# Patient Record
Sex: Female | Born: 1945 | Race: White | Hispanic: No | Marital: Married | Smoking: Never smoker
Health system: Southern US, Community
[De-identification: ages and names within clinical notes are randomized; demographics above are authoritative.]

## PROBLEM LIST (undated history)

## (undated) DIAGNOSIS — C569 Malignant neoplasm of unspecified ovary: Secondary | ICD-10-CM

## (undated) HISTORY — DX: Malignant neoplasm of unspecified ovary: C56.9

---

## 1998-06-30 ENCOUNTER — Encounter: Payer: Self-pay | Admitting: Obstetrics and Gynecology

## 1998-06-30 ENCOUNTER — Ambulatory Visit (HOSPITAL_COMMUNITY): Admission: RE | Admit: 1998-06-30 | Discharge: 1998-06-30 | Payer: Self-pay | Admitting: Obstetrics and Gynecology

## 1999-06-23 ENCOUNTER — Ambulatory Visit (HOSPITAL_COMMUNITY): Admission: RE | Admit: 1999-06-23 | Discharge: 1999-06-23 | Payer: Self-pay | Admitting: Obstetrics and Gynecology

## 1999-06-23 ENCOUNTER — Encounter: Payer: Self-pay | Admitting: Obstetrics and Gynecology

## 1999-07-09 ENCOUNTER — Other Ambulatory Visit: Admission: RE | Admit: 1999-07-09 | Discharge: 1999-07-09 | Payer: Self-pay | Admitting: Obstetrics and Gynecology

## 2000-06-24 ENCOUNTER — Ambulatory Visit (HOSPITAL_COMMUNITY): Admission: RE | Admit: 2000-06-24 | Discharge: 2000-06-24 | Payer: Self-pay | Admitting: Obstetrics and Gynecology

## 2000-06-24 ENCOUNTER — Encounter: Payer: Self-pay | Admitting: Obstetrics and Gynecology

## 2000-08-09 ENCOUNTER — Other Ambulatory Visit: Admission: RE | Admit: 2000-08-09 | Discharge: 2000-08-09 | Payer: Self-pay | Admitting: *Deleted

## 2001-07-10 ENCOUNTER — Encounter: Payer: Self-pay | Admitting: Obstetrics and Gynecology

## 2001-07-10 ENCOUNTER — Ambulatory Visit (HOSPITAL_COMMUNITY): Admission: RE | Admit: 2001-07-10 | Discharge: 2001-07-10 | Payer: Self-pay | Admitting: Obstetrics and Gynecology

## 2001-08-07 ENCOUNTER — Other Ambulatory Visit: Admission: RE | Admit: 2001-08-07 | Discharge: 2001-08-07 | Payer: Self-pay | Admitting: *Deleted

## 2002-06-28 ENCOUNTER — Ambulatory Visit (HOSPITAL_COMMUNITY): Admission: RE | Admit: 2002-06-28 | Discharge: 2002-06-28 | Payer: Self-pay | Admitting: Obstetrics and Gynecology

## 2002-06-28 ENCOUNTER — Encounter: Payer: Self-pay | Admitting: Obstetrics and Gynecology

## 2002-08-13 ENCOUNTER — Other Ambulatory Visit: Admission: RE | Admit: 2002-08-13 | Discharge: 2002-08-13 | Payer: Self-pay | Admitting: Obstetrics and Gynecology

## 2002-11-12 ENCOUNTER — Ambulatory Visit (HOSPITAL_COMMUNITY): Admission: RE | Admit: 2002-11-12 | Discharge: 2002-11-12 | Payer: Self-pay | Admitting: Gastroenterology

## 2003-07-02 ENCOUNTER — Ambulatory Visit (HOSPITAL_COMMUNITY): Admission: RE | Admit: 2003-07-02 | Discharge: 2003-07-02 | Payer: Self-pay | Admitting: Obstetrics and Gynecology

## 2003-09-04 ENCOUNTER — Other Ambulatory Visit: Admission: RE | Admit: 2003-09-04 | Discharge: 2003-09-04 | Payer: Self-pay | Admitting: Obstetrics and Gynecology

## 2004-07-03 ENCOUNTER — Ambulatory Visit (HOSPITAL_COMMUNITY): Admission: RE | Admit: 2004-07-03 | Discharge: 2004-07-03 | Payer: Self-pay | Admitting: Obstetrics and Gynecology

## 2004-09-08 ENCOUNTER — Other Ambulatory Visit: Admission: RE | Admit: 2004-09-08 | Discharge: 2004-09-08 | Payer: Self-pay | Admitting: Obstetrics and Gynecology

## 2005-07-12 ENCOUNTER — Ambulatory Visit (HOSPITAL_COMMUNITY): Admission: RE | Admit: 2005-07-12 | Discharge: 2005-07-12 | Payer: Self-pay | Admitting: Obstetrics and Gynecology

## 2005-09-21 ENCOUNTER — Other Ambulatory Visit: Admission: RE | Admit: 2005-09-21 | Discharge: 2005-09-21 | Payer: Self-pay | Admitting: Obstetrics and Gynecology

## 2006-07-26 ENCOUNTER — Ambulatory Visit (HOSPITAL_COMMUNITY): Admission: RE | Admit: 2006-07-26 | Discharge: 2006-07-26 | Payer: Self-pay | Admitting: Obstetrics and Gynecology

## 2006-09-23 ENCOUNTER — Other Ambulatory Visit: Admission: RE | Admit: 2006-09-23 | Discharge: 2006-09-23 | Payer: Self-pay | Admitting: Obstetrics & Gynecology

## 2007-08-16 ENCOUNTER — Ambulatory Visit: Admission: RE | Admit: 2007-08-16 | Discharge: 2007-08-16 | Payer: Self-pay | Admitting: Gynecologic Oncology

## 2007-08-22 ENCOUNTER — Ambulatory Visit (HOSPITAL_COMMUNITY): Admission: RE | Admit: 2007-08-22 | Discharge: 2007-08-22 | Payer: Self-pay | Admitting: Obstetrics and Gynecology

## 2007-08-30 DIAGNOSIS — C569 Malignant neoplasm of unspecified ovary: Secondary | ICD-10-CM

## 2007-08-30 HISTORY — PX: ABDOMINAL HYSTERECTOMY: SHX81

## 2007-08-30 HISTORY — DX: Malignant neoplasm of unspecified ovary: C56.9

## 2007-09-05 ENCOUNTER — Encounter: Payer: Self-pay | Admitting: Gynecology

## 2007-09-05 ENCOUNTER — Inpatient Hospital Stay (HOSPITAL_COMMUNITY): Admission: RE | Admit: 2007-09-05 | Discharge: 2007-09-07 | Payer: Self-pay | Admitting: Obstetrics & Gynecology

## 2007-09-06 ENCOUNTER — Ambulatory Visit: Payer: Self-pay | Admitting: Oncology

## 2007-09-26 ENCOUNTER — Ambulatory Visit: Admission: RE | Admit: 2007-09-26 | Discharge: 2007-09-26 | Payer: Self-pay | Admitting: Gynecologic Oncology

## 2007-10-02 LAB — CBC WITH DIFFERENTIAL/PLATELET
BASO%: 0.6 % (ref 0.0–2.0)
Basophils Absolute: 0 10*3/uL (ref 0.0–0.1)
HCT: 39.6 % (ref 34.8–46.6)
MCH: 31.5 pg (ref 26.0–34.0)
MCHC: 34.1 g/dL (ref 32.0–36.0)
MCV: 92.3 fL (ref 81.0–101.0)
NEUT#: 2.8 10*3/uL (ref 1.5–6.5)
Platelets: 338 10*3/uL (ref 145–400)
RBC: 4.28 10*6/uL (ref 3.70–5.32)
WBC: 4.7 10*3/uL (ref 3.9–10.0)
lymph#: 1.2 10*3/uL (ref 0.9–3.3)

## 2007-10-02 LAB — COMPREHENSIVE METABOLIC PANEL
Albumin: 4.3 g/dL (ref 3.5–5.2)
Alkaline Phosphatase: 54 U/L (ref 39–117)
CO2: 25 mEq/L (ref 19–32)
Calcium: 10 mg/dL (ref 8.4–10.5)
Chloride: 106 mEq/L (ref 96–112)
Sodium: 141 mEq/L (ref 135–145)
Total Protein: 7.1 g/dL (ref 6.0–8.3)

## 2007-11-14 ENCOUNTER — Ambulatory Visit: Admission: RE | Admit: 2007-11-14 | Discharge: 2007-11-14 | Payer: Self-pay | Admitting: Gynecologic Oncology

## 2008-02-13 ENCOUNTER — Ambulatory Visit: Admission: RE | Admit: 2008-02-13 | Discharge: 2008-02-13 | Payer: Self-pay | Admitting: Gynecologic Oncology

## 2008-02-29 ENCOUNTER — Ambulatory Visit: Payer: Self-pay | Admitting: Internal Medicine

## 2008-02-29 DIAGNOSIS — Z8543 Personal history of malignant neoplasm of ovary: Secondary | ICD-10-CM | POA: Insufficient documentation

## 2008-03-13 ENCOUNTER — Ambulatory Visit: Payer: Self-pay | Admitting: Internal Medicine

## 2008-03-13 LAB — CONVERTED CEMR LAB
BUN: 13 mg/dL (ref 6–23)
Basophils Absolute: 0.1 10*3/uL (ref 0.0–0.1)
Basophils Relative: 1.7 % (ref 0.0–3.0)
Bilirubin, Direct: 0.1 mg/dL (ref 0.0–0.3)
CO2: 32 meq/L (ref 19–32)
Creatinine, Ser: 0.7 mg/dL (ref 0.4–1.2)
Eosinophils Absolute: 0.1 10*3/uL (ref 0.0–0.7)
Eosinophils Relative: 2.8 % (ref 0.0–5.0)
GFR calc non Af Amer: 91 mL/min
HCT: 40.8 % (ref 36.0–46.0)
HDL: 40.8 mg/dL (ref >39.0)
Hemoglobin: 13.9 g/dL (ref 12.0–15.0)
Lymphocytes Relative: 33.4 % (ref 12.0–46.0)
Neutrophils Relative %: 53.3 % (ref 43.0–77.0)
RBC: 4.3 M/uL (ref 3.87–5.11)
RDW: 12.7 % (ref 11.5–14.6)
Total CHOL/HDL Ratio: 3.8
Total Protein: 6.8 g/dL (ref 6.0–8.3)
VLDL: 9 mg/dL (ref 0–40)

## 2008-05-15 ENCOUNTER — Ambulatory Visit: Admission: RE | Admit: 2008-05-15 | Discharge: 2008-05-15 | Payer: Self-pay | Admitting: Gynecologic Oncology

## 2008-06-18 ENCOUNTER — Ambulatory Visit: Payer: Self-pay | Admitting: Internal Medicine

## 2008-06-18 DIAGNOSIS — K3189 Other diseases of stomach and duodenum: Secondary | ICD-10-CM | POA: Insufficient documentation

## 2008-06-18 DIAGNOSIS — R1013 Epigastric pain: Secondary | ICD-10-CM

## 2008-08-06 ENCOUNTER — Ambulatory Visit (HOSPITAL_COMMUNITY): Admission: RE | Admit: 2008-08-06 | Discharge: 2008-08-06 | Payer: Self-pay | Admitting: Gynecologic Oncology

## 2008-08-07 ENCOUNTER — Ambulatory Visit: Admission: RE | Admit: 2008-08-07 | Discharge: 2008-08-07 | Payer: Self-pay | Admitting: Cardiothoracic Surgery

## 2008-08-07 ENCOUNTER — Encounter: Payer: Self-pay | Admitting: Internal Medicine

## 2008-08-09 ENCOUNTER — Ambulatory Visit (HOSPITAL_COMMUNITY): Admission: RE | Admit: 2008-08-09 | Discharge: 2008-08-09 | Payer: Self-pay | Admitting: Gynecologic Oncology

## 2008-08-26 ENCOUNTER — Telehealth: Payer: Self-pay | Admitting: Internal Medicine

## 2008-09-03 ENCOUNTER — Ambulatory Visit (HOSPITAL_COMMUNITY): Admission: RE | Admit: 2008-09-03 | Discharge: 2008-09-03 | Payer: Self-pay | Admitting: Obstetrics and Gynecology

## 2008-12-11 ENCOUNTER — Ambulatory Visit: Admission: RE | Admit: 2008-12-11 | Discharge: 2008-12-11 | Payer: Self-pay | Admitting: Gynecologic Oncology

## 2008-12-11 ENCOUNTER — Encounter: Payer: Self-pay | Admitting: Internal Medicine

## 2009-02-11 ENCOUNTER — Ambulatory Visit: Payer: Self-pay | Admitting: Internal Medicine

## 2009-02-11 DIAGNOSIS — M542 Cervicalgia: Secondary | ICD-10-CM | POA: Insufficient documentation

## 2009-02-11 LAB — CONVERTED CEMR LAB

## 2009-03-04 ENCOUNTER — Ambulatory Visit: Payer: Self-pay | Admitting: Internal Medicine

## 2009-03-04 LAB — CONVERTED CEMR LAB
AST: 21 units/L (ref 0–37)
Basophils Absolute: 0 10*3/uL (ref 0.0–0.1)
Basophils Relative: 1 % (ref 0–1)
Bilirubin, Direct: 0.1 mg/dL (ref 0.0–0.3)
Cholesterol: 169 mg/dL (ref 0–200)
Eosinophils Absolute: 0.1 10*3/uL (ref 0.0–0.7)
HDL: 53 mg/dL (ref >39)
Hemoglobin, Urine: NEGATIVE
Hemoglobin: 14.4 g/dL (ref 12.0–15.0)
LDL Cholesterol: 101 mg/dL — ABNORMAL HIGH (ref 0–99)
Lymphocytes Relative: 36 % (ref 12–46)
MCHC: 33.2 g/dL (ref 30.0–36.0)
Platelets: 299 10*3/uL (ref 150–400)
Potassium: 4.7 meq/L (ref 3.5–5.3)
RBC: 4.56 M/uL (ref 3.87–5.11)
RDW: 12.7 % (ref 11.5–15.5)
Specific Gravity, Urine: 1.004 — ABNORMAL LOW (ref 1.005–1.030)
Total Bilirubin: 0.6 mg/dL (ref 0.3–1.2)
Total CHOL/HDL Ratio: 3.2
Total Protein: 7.2 g/dL (ref 6.0–8.3)
Urobilinogen, UA: 0.2 (ref 0.0–1.0)
WBC: 5 10*3/uL (ref 4.0–10.5)
pH: 7.5 (ref 5.0–8.0)

## 2009-03-07 ENCOUNTER — Ambulatory Visit: Payer: Self-pay | Admitting: Internal Medicine

## 2009-03-07 DIAGNOSIS — R0989 Other specified symptoms and signs involving the circulatory and respiratory systems: Secondary | ICD-10-CM | POA: Insufficient documentation

## 2009-03-14 ENCOUNTER — Ambulatory Visit: Payer: Self-pay

## 2009-03-14 ENCOUNTER — Encounter: Payer: Self-pay | Admitting: Internal Medicine

## 2009-03-17 ENCOUNTER — Telehealth: Payer: Self-pay | Admitting: Internal Medicine

## 2009-03-17 ENCOUNTER — Encounter: Payer: Self-pay | Admitting: Internal Medicine

## 2009-04-16 ENCOUNTER — Ambulatory Visit: Admission: RE | Admit: 2009-04-16 | Discharge: 2009-04-16 | Payer: Self-pay | Admitting: Gynecologic Oncology

## 2009-04-16 ENCOUNTER — Encounter: Payer: Self-pay | Admitting: Internal Medicine

## 2009-09-10 ENCOUNTER — Ambulatory Visit: Admission: RE | Admit: 2009-09-10 | Discharge: 2009-09-10 | Payer: Self-pay | Admitting: Gynecologic Oncology

## 2009-09-16 ENCOUNTER — Ambulatory Visit (HOSPITAL_COMMUNITY): Admission: RE | Admit: 2009-09-16 | Discharge: 2009-09-16 | Payer: Self-pay | Admitting: Obstetrics and Gynecology

## 2009-10-17 ENCOUNTER — Encounter: Payer: Self-pay | Admitting: Internal Medicine

## 2010-02-11 ENCOUNTER — Ambulatory Visit: Admission: RE | Admit: 2010-02-11 | Discharge: 2010-02-11 | Payer: Self-pay | Admitting: Gynecologic Oncology

## 2010-04-07 ENCOUNTER — Telehealth: Payer: Self-pay | Admitting: Internal Medicine

## 2010-06-30 NOTE — Procedures (Signed)
Summary: Colonoscopy/[facility]  Colonoscopy/[facility]   Imported By: Lanelle Bal 10/28/2009 08:57:18  _____________________________________________________________________  External Attachment:    Type:   Image     Comment:   External Document

## 2010-06-30 NOTE — Progress Notes (Signed)
Summary: Lab Work  Phone Note Call from Patient Call back at [employer] [phone]   Caller: Patient Summary of Call: patient has scheduled a CPX for 12/8  are there additional labs needed for her outside of the routine labs? Initial call taken by: Glendell Docker CMA,  April 07, 2010 2:24 PM  Follow-up for Phone Call        is she getting CA - 125 ( tumor marker ) followed by oncologist? Follow-up by: D. Thomos Lemons DO,  April 07, 2010 3:23 PM  Additional Follow-up for Phone Call Additional follow up Details #1::        call paced to patient at [phone], no answer. A detailed voice message was left for patient to return call regarding blood test.  Additional Follow-up by: Glendell Docker CMA,  April 07, 2010 3:49 PM    Additional Follow-up for Phone Call Additional follow up Details #2::    patient returned call and stated she had testing done in September and it was normal Glendell Docker CMA  April 07, 2010 3:54 PM   Additional Follow-up for Phone Call Additional follow up Details #3:: Details for Additional Follow-up Action Taken: I suggest following labs BMP prior to visit, ICD-9:   Hepatic Panel prior to visit, ICD-9:  Lipid Panel prior to visit, ICD-9:   TSH prior to visit, ICD-9:  High sensitivity CRP :    use v70 code   Additional Follow-up by: D. Thomos Lemons DO,  April 08, 2010 11:34 AM  call was returned to patient, she was made aware of the fasting blood work needed prior to her scheduled appointment for December 8th, lab order entered for the last week in November  Glendell Docker [facility]  April 08, 2010 1:43 PM

## 2010-07-08 ENCOUNTER — Other Ambulatory Visit: Payer: Self-pay | Admitting: Gynecologic Oncology

## 2010-07-08 ENCOUNTER — Ambulatory Visit: Payer: BC Managed Care – PPO | Attending: Gynecologic Oncology | Admitting: Gynecologic Oncology

## 2010-07-08 DIAGNOSIS — R609 Edema, unspecified: Secondary | ICD-10-CM | POA: Insufficient documentation

## 2010-07-08 DIAGNOSIS — Z09 Encounter for follow-up examination after completed treatment for conditions other than malignant neoplasm: Secondary | ICD-10-CM | POA: Insufficient documentation

## 2010-07-08 DIAGNOSIS — C569 Malignant neoplasm of unspecified ovary: Secondary | ICD-10-CM | POA: Insufficient documentation

## 2010-08-21 NOTE — Consult Note (Signed)
NAMETEMPESTT, SILBA                ACCOUNT NO.:  [id number]  MEDICAL RECORD NO.:  [id number]           PATIENT TYPE:  LOCATION:                                 FACILITY:  PHYSICIAN:  Aracelis Ulrey A. 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  At that time, she underwent surgery and appropriate staging.  Pathology revealed a stage IA clear cell carcinoma, measuring 13 cm.  It was recommended that she undergo postoperative adjuvant chemotherapy with paclitaxel and carboplatin, but she declined.  She has been followed expectantly since that time.  She is overall doing quite well and really has no complaints.  I last saw her in September 2011 at which time her exam was unremarkable and her CA-125 was normal at 7.4.  She overall is doing quite well.  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  She does have some occasional right lower extremity edema that is really a bigger issue for her in the summer months and it is in the winter.  The right lower quadrant tingy pain that she had before is abated itself somewhat. She continues to exercise and is enjoying a good quality of life.  REVIEW OF SYSTEMS:  Negative.  FAMILY HISTORY:  Noncontributory.  There are no new medical problems. Her grandson is year and half old.  MEDICATION LIST:  Reviewed and is unchanged.  PHYSICAL EXAMINATION:  VITAL SIGNS:  Weight 152 pounds, height 5 feet 10 inches, blood pressure 152/88, pulse 72, respirations 16, temperature 97.9. GENERAL:  A well-nourished and well-developed  female, in no acute distress. NECK:  Supple.  There is no lymphadenopathy, no thyromegaly. LUNGS:  Clear to auscultation bilaterally. CARDIOVASCULAR:  Regular rate and rhythm. ABDOMEN:  A well-healed vertical midline incision.  Abdomen is soft, nontender, nondistended.  There are no palpable masses or hepatosplenomegaly.  Groins are negative for adenopathy. EXTREMITIES:  There is no edema. PELVIC:  External genitalia is within normal limits.  Bimanual examination reveals no masses or nodularity.  Rectal confirms.  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  PLAN: 1. We will follow up the results of her CA-125 from today.  If all is     well, she will return to see [place] in 6 months.     She knows to contact us if she has any issues prior to her next     visit. 2. She is due for a mammogram in April. 3. She had a colonoscopy in June 2011 and is fine for 5 years.  Scarlette Hogston A. Duard Brady, MD     PAG/MEDQ  D:  07/08/2010  T:  07/08/2010  Job:  [id number]  cc:   Edwena Felty. Romine, M.D. Fax: [phone]  Telford Nab, R.N. [address] [place], [place] [address]  Lennis P. Darrold Span, M.D. Fax: [phone]  Barbette Hair. Lockhart, DO [address] [place], [place] [address]  Dr. Jerline Pain  Electronically Signed by Cleda Mccreedy MD on 07/16/2010 10:18:39 AM

## 2010-09-02 LAB — CA 125: CA 125: 6.5 U/mL (ref 0.0–30.2)

## 2010-09-10 LAB — GLUCOSE, CAPILLARY: Glucose-Capillary: 99 mg/dL (ref 70–99)

## 2010-09-10 LAB — CA 125: CA 125: 7.4 U/mL (ref 0.0–30.2)

## 2010-10-05 ENCOUNTER — Other Ambulatory Visit: Payer: Self-pay | Admitting: Obstetrics and Gynecology

## 2010-10-05 DIAGNOSIS — Z1231 Encounter for screening mammogram for malignant neoplasm of breast: Secondary | ICD-10-CM

## 2010-10-13 NOTE — Consult Note (Signed)
NAMESHATIRA, DOBOSZ                ACCOUNT NO.:  [id number]   MEDICAL RECORD NO.:  [id number]          PATIENT TYPE:  OUT   LOCATION:  GYN                          FACILITY:  [facility]   PHYSICIAN:  Paola A. Duard Brady, MD    DATE OF BIRTH:  [birth year]   DATE OF CONSULTATION:  DATE OF DISCHARGE:                                 CONSULTATION   [name] is a 65 year old with a history of a stage IA clear cell  carcinoma of the ovary.  Please refer to my extensive note from September 26, 2007 as well as that from Dr. Jama Flavors on Oct 02, 2007.  Briefly, the patient presented as a referral from Dr. Tresa Res for  abdominal pelvic mass with a normal CA125.  April 17 she underwent  TAH/BSO and appropriate staging.  Final pathology revealed the clear  cell carcinoma measuring 13 cm arising from the right ovary with  negative surface involvement. Lymph nodes were negative, washings,  peritoneal biopsies, omentum, etc.  It was recommended that she proceed  with six cycles of postoperative chemotherapy with Taxol and  carboplatin.  She was very undecided regarding her treatment strategy  when we saw her in April.  She did agree to see Dr. Darrold Span.  Prior to  that, she did miss a chemotherapy teaching appointment.  After she saw  Dr. Darrold Span, it appears that the patient has yet to make a final  decision, though she appears to be leaning towards close followup  She  continues to do well and states she is recovering well from the surgery.  She does complain of a small amount of swelling in the lower mons area,  increases during the day.  It is much better in the morning.  She is  also complaining of some lower back pain initially upon waking up.  It  improves during the day as she gets up and stretches.  Her bowels her  regular.  She denies any change in her bowel or bladder habits.  Towards  the right side towards the umbilicus is a little bit more sensitive with  movement.  It is better this week than it  had been.  Since we last saw  her, her mother did pass away, which has been a stressor for her.  She  otherwise denies any significant complaints.   PHYSICAL EXAMINATION:  Weight 147.5 pounds.  Blood pressure 125/84.  ABDOMEN:  Shows well-healed vertical midline incision.  She does have  some edema overlying the mons.  Groins are negative for adenopathy.  EXTREMITIES:  There is no edema.  PELVIC:  External genitalia is within normal limits though atrophic.  The vagina is atrophic.  The vaginal cuff is visualized.  There are no  visible lesions.  Bimanual examination reveals no masses or nodularity.  Rectal confirms.   65 year old with a surgical stage IA clear cell carcinoma of the  ovary, who appears to be leaning towards conservative followup and no  adjuvant chemotherapy, though she has not completely defined her  treatment strategy at this point.  I discussed with  her that we really  have only about 2 more weeks to finalize a decision as you start losing  the therapeutic benefit of adjuvant therapy with significant delays.  She seems to understand that.  She has been offered second opinions both  by [place] as well as Dr. Darrold Span, and at this point she states she is  considering going to [facility] as they can integrate  conventional chemotherapy with complimentary medications.  Again I urged  her if she is going to seek a second opinion, to make this appointment.  We would be happy to help her if she would like.  She deferred that  request.  However, if she is going to have adjuvant therapy, it was  stressed upon her and she appeared to understand the need for immediacy.  Should she decide not to proceed with adjuvant  therapy, we will follow her continuously and she will return to see me  in 3 months.  We will schedule her, should she opt for not proceeding  with chemotherapy, for a CT of the abdomen, pelvis in April, which is  the anniversary of her surgery.   She is requesting that we get a vitamin  D level today.  She will notify us of her decision.      Paola A. Duard Brady, MD  Electronically Signed     PAG/MEDQ  D:  11/14/2007  T:  11/14/2007  Job:  [id number]   cc:   Edwena Felty. Romine, M.D.  Fax: [phone]   Telford Nab, R.N.  [address]  [place], [place] [address]   Lennis P. Darrold Span, M.D.  Fax: [phone]

## 2010-10-13 NOTE — Consult Note (Signed)
[name], [name]                ACCOUNT NO.:  [id number]   MEDICAL RECORD NO.:  [id number]          PATIENT TYPE:  OUT   LOCATION:  GYN                          FACILITY:  [facility]   PHYSICIAN:  Paola A. Duard Brady, MD    DATE OF BIRTH:  05-Feb-1946   DATE OF CONSULTATION:  08/16/2007  DATE OF DISCHARGE:                                 CONSULTATION   HISTORY:  The patient is a 65 year old gravida 1, para 1 who began  having some bloating in January of 2009 with some increased gas and  discomfort.  Really no significant pain, but what she described as some  twinges.  She had known fibroids, and thought that this was causing  these symptoms.  She also thought that maybe with the increasing  flatulence, she had some irritable bowel syndrome.  The symptoms really  were not getting any worse, but persistent.  She went to see Dr. Tresa Res  with complaints of 3-4 weeks of pelvic discomfort and bloating.   Examination at that time revealed an abdominal pelvic mass.  Ultrasound  was performed that revealed a large complex mass measuring 14 x 11.8 x  9.5 cm seen anterior to the uterus at the midline in location with an  irregular shaped solid component which measures 7 x 4.6 cm.  There were  blood vessels noted within the area that displayed high diastolic flow.  There was no significant ascites in the mass or within the pelvis.  The  right adnexa, itself, could not be easily identified.  A CA-125 was  performed that was 9.5.  It was felt that this mass appeared to be  separate from the uterus, and was most likely adnexal in origin.  She  states that since that evaluation, again, there have been no significant  symptom changes.  The symptoms are persistent and uncomfortable.  She  denies any significant pain requiring any pain medication.  She sleeps  well at night.  The pain does not wake her up at night.  She does not  have any change in bowel or bladder habits.  She does have some pressure  which  increases some urinary frequency.  She denies any nausea,  vomiting, or early satiety.  She has been menopausal since her 51s with  no hormone replacement therapy, and she has had no vaginal bleeding.   PAST MEDICAL HISTORY:  None.   PAST SURGICAL HISTORY:  None.   MEDICATIONS:  Multivitamin, calcium, vitamin D, and herbal supplements.   ALLERGIES:  SULFA which causes itching and throat tightness.   SOCIAL HISTORY:  She denies any use of tobacco or alcohol.  She is  married.  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  Her  husband works at the [employer].  She has a son who is 58.  She  has no grandchildren.   FAMILY HISTORY:  Her father died of an MI at 5.  Her mother is 25 and  has had a stroke and diabetes.  Maternal grandfather with colon cancer.  She has a sister with breast cancer  in her 28s.  She is not sure if she  was menopausal.   HEALTH MAINTENANCE:  She is up-to-date on her colonoscopies with her  last one being in June of 2004.  She states that she will have one this  June as it will be 5 years.  Mammogram she is up-to-date.   PHYSICAL EXAMINATION:  VITAL SIGNS:  Weight 149 pounds, height 5 feet 9-  1/2 inches.  Blood pressure 144/82, pulse 88.  GENERAL:  A well-nourished, well-developed female in no acute distress.  NECK:  Supple.  There is no lymphadenopathy, no thyromegaly.  LUNGS:  Clear to auscultation bilaterally.  CARDIOVASCULAR EXAM:  Regular rate and rhythm.  ABDOMEN:  There is an abdominal pelvic mass palpable about 4  fingerbreadths below the umbilicus.  It is nontender.  There are no  other masses or hepatosplenomegaly.  Groins are negative for adenopathy.  EXTREMITIES:  There is no edema.  PELVIC:  External genitalia is within normal limits.  On bimanual  examination the cervix is palpably normal.  There is an abdominal pelvic  mass measuring approximately 16 cm, and it is freely mobile.  I cannot  separate the mass from the  uterus, it moves with the uterus, and it  could be either a large fibroid versus an ovarian mass that is adherent  to the anterior portion of the uterus.  It does feel to be anterior to  the uterus, and deviating the uterus posteriorly.  RECTOVAGINAL EXAMINATION:  Reveals no nodularity.   IMPRESSION:  A pleasant 65 year old with a complex 14 cm abdominal  pelvic mass normal CA-125.  The mass has both cystic and solid  components which are worrisome for a neoplastic process; however,  reassuring features include normal CA-125, mobility of the mass, and  lack of nodularity and ascites within the pelvis.  The etiology could  either be a primary uterine mass versus an ovarian process.  I do not  feel that this is something that can be laparoscopically evaluated.  The  patient needs exploratory an laparotomy TAH-BSO and appropriate staging.  The patient is open to this.  We will work on getting her scheduled.  The risks and benefits of surgery were discussed.  She knows that the  mass will be sent for frozen section.  If malignant, she will undergo  appropriate staging.  She is willing to undergo hysterectomy and  contralateral BSO if this is arising from the ovary even if it is benign  process.  We will attempt to coordinate the surgery with our service and  Dr. Harlene Salts and Miller's office.  We understand that Dr. Tresa Res will be  out of the office for a period of time, and if Dr. Hyacinth Meeker is not able to  assist [place],  the patient is happy to have Dr. Tamela Oddi assisting [place]  with the surgery.  I will coordinate the schedules and contact the  patient with the next reasonable date available.  She understands that  if I am not available in a timely fashion, that one of my partners will  do the surgery.  She will contemplate whether this is acceptable to her,  so that when [place] calls her with potential dates, we can get her  scheduled.  Her questions were answered and elicited to her   satisfaction.  We will schedule her for surgery.      Paola A. Duard Brady, MD  Electronically Signed     PAG/MEDQ  D:  08/16/2007  T:  08/17/2007  Job:  [id number]   cc:   Edwena Felty. Romine, M.D.  Fax: [phone]   Telford Nab, R.N.  [address]  [place], [place] [address]

## 2010-10-13 NOTE — Consult Note (Signed)
[name], [name]                ACCOUNT NO.:  [id number]   MEDICAL RECORD NO.:  [id number]          PATIENT TYPE:  OUT   LOCATION:  GYN                          FACILITY:  [facility]   PHYSICIAN:  Paola A. Duard Brady, MD    DATE OF BIRTH:  [birth year]   DATE OF CONSULTATION:  09/26/2007  DATE OF DISCHARGE:                                 CONSULTATION   Ms. Warrick is a very pleasant, 65 year old who we initially saw as a  referral from Dr. Tresa Res. At that time, she had an abdominal pelvic mass  measuring approximately 14 x 11.8 x 9.5 cm.  She had a CA-125 that was  normal.  On physical examination, she had an abdominal pelvic mass  measuring 16 cm that was freely mobile.  She subsequently underwent TAH-  BSO and appropriate staging on September 15, 2007.  Operative findings  revealed a 12-cm cystic mass arising from the right ovary.  The external  surface was smooth.  There were no excrescences, and there was no  appearance of the surface.  There was some small fibroids in the uterus.  Left tube and ovary were normal.  Pathology was consistent with a clear  cell carcinoma measuring 13 cm.  It was arising from the right ovary  with a negative surface.  The cervix, endometrioma, myometrium, serosa,  left adnexa were negative.  She did have some atrophic endometriosis on  her contralateral adnexa.  Peritoneal biopsies, cul-de-sac biopsies,  omentum and washings were all negative as was the appendix.  A 0 out of  14 nodes were involved.  Her tumor was described as a clear cell  carcinoma.  Her final stage was stage IA grade 3 carcinoma.  She comes  in today for a brief postoperative check but he mostly for chemotherapy-  related discussion.  She is walking about a quarter to half a mile a  day.  She is lifting no more than 10 pounds a day.  She has gone back to  work a little bit from home.  Her energy level is good.  She  occasionally has some twinges in her bilateral lower quadrant that feels  a  little bit tight.  She is sleeping well at night.  She is eating well.  She denies any change in bowel or bladder habits.  She is inquiring  regarding travel as her mother has pneumonia and is not doing well in  [place], and she would like to go see her.  She brought a long list of  questions today that were addressed.  The biggest issue seems to really  surround whether or not she would like to proceed with chemotherapy.  Her husband is here with her today.  He has a history of stage III  bladder carcinoma diagnosed and treated 7 years ago and has been in  remission since that time, and some of the questions really seem to  surround was some malignancies require adjuvant therapy and others do  not.   PHYSICAL EXAMINATION:  Well-nourished, well-developed female in no acute  distress.  ABDOMEN:  She has  a well-healing vertical midline incision.  There was  one Steri-Stripped place which was removed without difficulty.  Abdomen  is soft, appropriately tender.  There are normal postoperative changes  and edema.   ASSESSMENT:  A 65 year old with a stage IA clear cell carcinoma of the  ovary who is doing well for this point in her postoperative status.  She  is only three weeks out from surgery.   PLAN:  Again, we had a lengthy discussion.  She was given a copy of her  pathology report.  They inquired whether or not that she get a second  opinion for pathology.  I discussed that they are weighs welcome to get  a second opinion regarding pathology.  If they choose to do so, that  would be their choice and decision.  We had a discussion regarding  chemotherapy.  I would recommend 6 cycles of paclitaxel and carboplatin.  She was given the opportunity the write the names of these chemotherapy  agents down.  I did discuss the most common risks and side effects  associated with these chemotherapeutic agents including alopecia,  neuropathy, and issues with her blood counts.  I discussed with her  that  she be able to work full time.  She inquired whether she would be able  to do any immune-enhancing supplementation during chemotherapy, and I  discussed with her that my patients often do seek other adjuvant and  holistic approaches.  I only asked that I be able to review them prior  to them starting anything.  I am sure Dr. Darrold Span would feel similarly.  The patient does have an appointment see Dr. Darrold Span on May 4 at 9:30.  She was given the date and time.  At this point, she is not fully  admitted to proceeding with chemotherapy but does agree with seeing Dr.  Darrold Span as it would be a good information-gathering session.  She can  then make her final decision.  I did discuss with her that she currently  has approximately a 20-25% chance of having a cancer recurrence and then  adjuvant therapy may decrease the recurrence risk in half, but she would  still have about a 8-10% risk of cancer recurrence based on her  histology alone.  She seemed to understand that.  Their questions were  elicited and answered to their satisfaction.  They follow-up with Dr.  Darrold Span, and they notify us of their final decision regarding adjuvant  therapy.      Paola A. Duard Brady, MD  Electronically Signed     PAG/MEDQ  D:  09/26/2007  T:  09/26/2007  Job:  [id number]   cc:   Aram Beecham P. Romine, M.D.  Fax: [phone]   Telford Nab, R.N.  [address]  [place], [place] [address]   Lennis P. Darrold Span, M.D.  Fax: [phone]

## 2010-10-13 NOTE — Op Note (Signed)
[name], [name]                ACCOUNT NO.:  [id number]   MEDICAL RECORD NO.:  [id number]          PATIENT TYPE:  INP   LOCATION:  0002                         FACILITY:  [facility]   PHYSICIAN:  De Blanch, M.D.DATE OF BIRTH:  12-Sep-1945   DATE OF PROCEDURE:  09/05/2007  DATE OF DISCHARGE:                               OPERATIVE REPORT   PREOPERATIVE DIAGNOSIS:  Complex pelvic mass.   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   PROCEDURE:  Total abdominal hysterectomy, bilateral salpingo-  oophorectomy, staging for ovarian cancer including omentectomy, pelvic  and periaortic lymphadenectomy, multiple peritoneal biopsies, peritoneal  washings, diaphragm scrape and appendectomy.   SURGEON:  De Blanch, M.D.   FIRST ASSISTANT:  Antionette Char MD, Telford Nab, R.N.   ANESTHESIA:  General with orotracheal tube.   ESTIMATED BLOOD LOSS:  200 mL.   SURGICAL FINDINGS:  At time of exploratory laparotomy there was a 12 cm  cystic mass arising from the right ovary.  The external surface was  smooth.  There were no excrescences and there was not adherence to any  of the surfaces.  There were some small fibroids in the uterus.  The  left tube and ovary were normal.  Careful exploration of the peritoneal  cavity including diaphragm showed no evidence of other peritoneal  implants.  The liver capsule, spleen, stomach omentum small and large  bowel were all normal.  The appendix appeared normal.  At the completion  of the surgical procedure there was no gross residual disease.   PROCEDURE:  The patient was brought to the operating room and after  placement in Garfield stirrups in modified lithotomy position, the anterior  abdominal wall, perineum and vagina were prepped with Betadine.  A Foley  catheter was inserted and the patient was draped.  The abdomen was  entered through a midline incision.  Peritoneal washings were obtained  from the  pelvis.  The upper abdomen and pelvis were explored with the  above-noted findings.  Bookwalter retractor was positioned and bowel  packed out of pelvis.  The right retroperitoneal space was opened  identifying the vessels and ureter.  The ovarian vessels were  skeletonized, clamped, cut, free tied and suture ligated with 2-0  Vicryl.  The uterine cornu was crossclamped and the fallopian tube and  ovarian ligament divided.  The right fallopian tube and right ovary were  submitted to frozen section.  Pathology returned showing this to be a  clear cell carcinoma of the ovary.  The left retroperitoneal space was  opened.  The vessels were exposed as was the ureter.  The ovarian  vessels were skeletonized, clamped, cut, free tied and suture ligated.  The bladder flap was advanced with sharp and blunt dissection.  The  uterine vessels were skeletonized, clamped, cut and suture ligated.  In  stepwise fashion the paracervical and cardinal ligaments were clamped,  cut and suture ligated.  The vaginal angles were crossclamped, the  vagina transected from its connection with the cervix.  The uterus,  cervix, left tube and ovary handed off the operative field.  Vaginal  angles were transfixed with 0 Vicryl.  Central portion vagina closed  with interrupted figure-of-eight sutures of 0 Vicryl.  Peritoneal  biopsies were obtained from the right and left pelvic sidewalls, the  posterior cul-de-sac and the bladder peritoneum.  The retroperitoneal  spaces were further developed and pelvic lymphadenectomy was performed  bilaterally excising all lymph nodes from the external iliac artery and  vein, internal iliac artery and obturator fossa.  The obturator nerve  was exposed throughout the procedure and care was taken to avoid injury  to the nerve or vessels.  The patient had minimal amount of lymphatic  and fatty tissue along the external iliac vessels.  The retractor was  repositioned and the upper abdomen  exposed.  The omentum was brought in  the operative field and an infracolic omentectomy was performed.  Vascular pedicles were clamped and free tied using 2-0 Vicryl suture.  Biopsies were taken from the right and left paracolic gutter peritoneum.   An incision was made along the right common iliac artery and along the  aorta.  The right ureter was mobilized laterally.  The ovarian vein was  identified inserting into the vena cava.  Lymph nodes were excised from  the aorta and vena cava extending approximately 4 cm above the inferior  mesenteric artery.  Care was taken to avoid injury to the ovarian vein.  With the surgical field was evaluated and found to be hemostatic.   A Pap smear was obtained from the diaphragm searching for abnormal  cytology.   The appendix was brought in the operative field.  The appendiceal artery  was identified crossclamped, divided and suture ligated with 2-0 Vicryl.  The appendiceal stump was then crossclamped and incised.  The  appendiceal stump was then suture ligated and a pursestring suture of 2-  0 Vicryl was created and the appendiceal stump inverted beneath the  pursestring suture.  The abdomen pelvis were reexplored and found to be  hemostatic.  The pelvis and abdomen were irrigated with saline.  Packs  and retractors removed.  The anterior abdominal wall was closed in  layers the first being a running MAS closure using #1 PDS.  Subcutaneous  tissue was irrigated.  Skin was closed with skin staples.  A dressing  was applied.  The patient was awakened from anesthesia and taken to the  recovery room in satisfactory condition.  Sponge, needle and instrument  counts correct x2.      De Blanch, M.D.  Electronically Signed     DC/MEDQ  D:  09/05/2007  T:  09/05/2007  Job:  [id number]   cc:   Edwena Felty. Romine, M.D.  Fax: [phone]   Roseanna Rainbow, M.D.  Fax: [phone]   Telford Nab, R.N.  [address]  [place],  [place] [address]

## 2010-10-13 NOTE — Consult Note (Signed)
NAMEMARIAEDUARDA, DEFRANCO                ACCOUNT NO.:  [id number]   MEDICAL RECORD NO.:  [id number]          PATIENT TYPE:  OUT   LOCATION:  GYN                          FACILITY:  [facility]   PHYSICIAN:  Paola A. Duard Brady, MD    DATE OF BIRTH:  January 14, 1946   DATE OF CONSULTATION:  08/07/2008  DATE OF DISCHARGE:                                 CONSULTATION   Ms. Manrique is a 65 year old with a stage IA clear cell carcinoma of the  ovary who in April of 2009 underwent exploratory laparotomy, TAH-BSO and  appropriate staging.  Final pathology revealed a stage IA clear cell  carcinoma measuring 13 cm.  All biopsies and lymph nodes were negative.  It was recommended that she proceed with adjuvant chemotherapy  consisting of paclitaxel and carboplatin x6 cycles, but she declined.  She has been followed closely since that time and has had no evidence of  clinical recurrent disease.  I last saw her in December of 2009, at  which time her review of systems was negative, and her CA-125 was normal  at 6.6, and her exam was negative.  She comes in today for follow up.  She did have a CT scan of the abdomen and pelvis on August 06, 2008.  It  revealed a focal area of low density within the spleen measuring 5 mm.  The adrenal glands and pancreas were normal.  There were several small  foci of low density within the right hepatic lobe.  There was no  intrahepatic biliary dilatation.  There was a periaortic lymph node at  the level of the diaphragm that measured 1 cm in short axis diameter.  There was no free fluid or other masses seen.  Other than this  borderline enlarged periaortic lymph node, there was no evidence of  recurrent disease.  The pelvis was negative.  The patient herself is  overall doing quite well.  She did have a GI bug over the holidays, and  it has taken her a little bit longer to get over than it did her son.  She had some nausea and diarrhea.  It was felt to be most likely a viral  gastroenterology.  She does occasionally, if she has some constipation  with a bowel movement, have a twinge of some soreness in the right groin  just above the incision area.  It does not hurt her all the time.  She  denies any change in her bowel movements.   MEDICATIONS:  No changes.  She continues to take her nutritional  supplements including multivitamins, selenium, DHA,  CoQ10, Weil's  immune support formula with mushroom blend, calcium, magnesium and  vitamin D.   HEALTH MAINTENANCE:  She has a mammogram scheduled at the end of this  month.   PHYSICAL EXAMINATION:  VITAL SIGNS:  Weight 152 pounds which is stable.  GENERAL:  A well-nourished, well-developed female in no acute distress.  NECK:  Supple.  There is no lymphadenopathy, no thyromegaly.  LUNGS:  Clear to auscultation bilaterally.  CARDIOVASCULAR:  Regular rate and rhythm.  ABDOMEN:  A well-healed  vertical midline incision.  Abdomen is soft,  nontender, nondistended.  There are no palpable masses or  hepatosplenomegaly.  There is no evidence of any incisional hernias.  Groins are negative for adenopathy.  EXTREMITIES:  She has 1+ edema, equal bilaterally.  It is not pitting.  It is greater on the right than on the left, primarily in her ankles.  PELVIC:  External genitalia is within normal limits.  Bimanual  examination reveals no masses or nodularity.  There is no tenderness.  RECTAL:  Confirms.   ASSESSMENT AND PLAN:  17. A 65 year old with a stage IA clear cell carcinoma of the ovary who      declined postoperative adjuvant therapy  who on CT scan has a 1 cm      periaortic node near the diaphragm.  After our discussion, I      believe that this would be best evaluated by a PET scan.  Will      schedule her for a PET CT and will contact her with the results.      She is quite anxious to know the results.  She knows that I will be      out of town for a portion of next week, and she would be happy if      Telford Nab were able to call her with the results when they      become available.  Will determine her disposition pending the      results of her PET scan.  2. She will keep her mammogram appointment.      Paola A. Duard Brady, MD  Electronically Signed     PAG/MEDQ  D:  08/07/2008  T:  08/07/2008  Job:  [id number]   cc:   Edwena Felty. Romine, M.D.  Fax: [phone]   Telford Nab, R.N.  [address]  [place], [place] [address]   Lennis P. Darrold Span, M.D.  Fax: [phone]   Colette Ribas. [place], DO  [address] [place], [place] [address]

## 2010-10-13 NOTE — Discharge Summary (Signed)
[name], [name]                ACCOUNT NO.:  [id number]   MEDICAL RECORD NO.:  [id number]          PATIENT TYPE:  INP   LOCATION:  1533                         FACILITY:  [facility]   PHYSICIAN:  Roseanna Rainbow, M.D.DATE OF BIRTH:  [birth year]   DATE OF ADMISSION:  09/05/2007  DATE OF DISCHARGE:  09/07/2007                               DISCHARGE SUMMARY   CHIEF COMPLAINT:  The patient is a 65 year old with a complex pelvic  mass and normal CA-125 who presents for operative management.  Please  see the dictated history and physical as per Dr. Stanford Breed.   HOSPITAL COURSE:  The patient was admitted and underwent a total  abdominal hysterectomy, bilateral salpingo-oophorectomy, omentectomy,  pelvic and periaortic lymphadenectomies, peritoneal biopsies, washings,  appendectomy.  Please see the dictated operative summary.  On  postoperative day #1, her hemoglobin was 12.5.  The basic metabolic  profile was normal.  She was started on Lovenox DVT prophylaxis.  Her  diet was advanced.  She was discharged to home on postoperative day #2  tolerating a regular diet.   DISCHARGE DIAGNOSIS:  Stage 1a clear cell carcinoma, right ovary.  Leiomyomata, endometriosis.   PROCEDURE:  Total abdominal hysterectomy, bilateral salpingo-  oophorectomy, omentectomy, pelvic and periaortic lymphadenectomies,  peritoneal biopsies, washings and appendectomy.   CONDITION:  Good.   DIET:  Regular.   ACTIVITY:  Pelvic rest, progressive activity.   MEDICATIONS:  Included Percocet.   DISPOSITION:  The patient was to follow up for staple removal on April  15 and to follow up with Dr. Duard Brady on April 28 at 4:15 p.m.      Roseanna Rainbow, M.D.  Electronically Signed     LAJ/MEDQ  D:  09/19/2007  T:  09/19/2007  Job:  [id number]   cc:   Edwena Felty. Romine, M.D.  Fax: [phone]   Telford Nab, R.N.  [address]  [place], [place] [address]

## 2010-10-13 NOTE — Consult Note (Signed)
[name], [name]                ACCOUNT NO.:  [id number]   MEDICAL RECORD NO.:  [id number]          PATIENT TYPE:  OUT   LOCATION:  GYN                          FACILITY:  [facility]   PHYSICIAN:  Paola A. Duard Brady, MD    DATE OF BIRTH:  [birth year]   DATE OF CONSULTATION:  05/15/2008  DATE OF DISCHARGE:                                 CONSULTATION   The patient is a very pleasant 65 year old with a stage IA clear cell  carcinoma of the ovary who in April 2009 underwent exploratory  laparotomy TAH-BSO and appropriate staging.  Final pathology revealed a  stage IA clear cell carcinoma measuring 13 cm.  All biopsies were  negative.  It was recommended she proceed with 6 cycles of chemotherapy  but she has declined.  She was seen by Dr. Jerline Pain at the [facility].  Several nutritional supplements recommended  and she opted for that rather than proceeding with any chemotherapy.  She is overall doing really quite well.  She states the postoperative  swelling is decreasing.  She has been doing some acupuncture which has  helped with her wound healing.  She has been seen by a nutritionist and  as she is vegetarian, she wanted to make sure she was getting enough  protein, which they feel she is.  She had some moles removed which were  abnormal.  There was no evidence of carcinoma.  It was recommended that  she have followup in 3 months.  She is also going to be scheduled for  mammogram in the early part of the year as well as colonoscopy.   MEDICATIONS:  None.  She is taking several nutritional supplements,  including multivitamins, selenium, DHA, curcumin, coenzyme Q 12.  Weil's  Immune Support Formula with mushroom blend, calcium, magnesium and  vitamin D.   PHYSICAL EXAMINATION:  VITAL SIGNS:  Weight 150 pounds, blood pressure  146/69.  CONSTITUTIONAL:  Well-nourished, well-developed female in no acute  distress.  NECK:  Supple with no lymphadenopathy, no  thyromegaly.  LUNGS:  Clear to auscultation bilaterally.  CARDIOVASCULAR:  Regular rate and rhythm.  ABDOMEN:  Shows well-healed vertical midline incision.  Abdomen is soft,  nontender, nondistended.  No palpable masses or hepatosplenomegaly.  Groins are negative for adenopathy.  EXTREMITIES:  She has 1+ nonpitting edema at her ankles, right greater  than left.  PELVIC:  External genitalia is within normal limits.  Bimanual  examination reveals no masses or nodularity.  Rectal confirms.   ASSESSMENT:  A 65 year old with stage IA clear cell carcinoma of the  ovary who declined postoperative adjuvant therapy.  Will follow results  for CA-125 from today.   PLAN:  1. I believe that she has a mild amount of lymphedema in her lower      extremities, diminishing long-term standing and a low salt diet      were discussed with the patient.  2. She will return to see [place] in 3 months.  At that time we will get      her scheduled for a CT scan  of the abdomen      and pelvis for April 2010 which will be her 1-year anniversary.  If      at that time her CT scan is negative, she can return to see [place] in 4      months.  We will proceed with every 4 months followup until 2012      and then after 3 years of followup, she will be followed q.6      months.      Paola A. Duard Brady, MD  Electronically Signed     PAG/MEDQ  D:  05/15/2008  T:  05/16/2008  Job:  [id number]   cc:   Edwena Felty. Romine, M.D.  Fax: [phone]   Telford Nab, R.N.  [address]  [place], [place] [address]   Lennis P. Darrold Span, M.D.  Fax: [phone]   Zadie Cleverly

## 2010-10-13 NOTE — Consult Note (Signed)
[name], [name]                ACCOUNT NO.:  [id number]   MEDICAL RECORD NO.:  [id number]          PATIENT TYPE:  OUT   LOCATION:  GYN                          FACILITY:  [facility]   PHYSICIAN:  [name]    DATE OF BIRTH:  1946-05-31   DATE OF CONSULTATION:  02/13/2008  DATE OF DISCHARGE:  02/13/2008                                 CONSULTATION   The patient is a 65 year old with a history of stage IA clear cell  carcinoma of the Ovary, who in April 2009, underwent exploratory  laparotomy, TAH-BSO and appropriate staging.  Final pathology revealed  a stage IA clear cell carcinoma of the ovary measuring 13 cm.  With  stage IA based on comprehensive staging, it was recommend that she  proceed with six cycles of postoperative chemotherapy with paclitaxel  and carboplatin.  She was seen by Dr. Darrold [name] and again seen by me.  After lengthy discussions and multiple discussions, the patient has  opted for no additional therapy.  She was seen at the [facility], where she saw Dr. Lula [name].  Per the patient, they  did not relieve discuss chemotherapeutic options, but she has instituted  some dietary changes, some nutritional supplements and some exercise  therapy and she is overall feeling well.  She has taken several months  to meet with nutritionist and work on getting her head on straight.  She states she is really feeling quite well.  She is exercising, she is  walking, doing yoga.  She states the abdominal pain and swelling has  decreased significantly.  She denies any change in her bowel or bladder  habits, any nausea, early satiety or abdominal bloating.   MEDICATIONS:  Multivitamins, calcium, magnesium, DHEA, Omega-3, immune  support a probiotic and turmeric.   HEALTH MAINTENANCE:  She had a mammogram in February 2009.   PHYSICAL EXAMINATION:  VITAL SIGNS:  Weight 148 pounds, blood pressure  134/81.  GENERAL:  A well-nourished, well-developed  female in no acute distress.  NECK:  Supple.  There is no lymphadenopathy, no thyromegaly.  LUNGS:  Clear to auscultation bilaterally.  CARDIOVASCULAR:  Regular rate and rhythm.  ABDOMEN:  She has a well-healed vertical midline incision.  Abdomen is  soft, nontender, nondistended.  There are no palpable masses or  hepatosplenomegaly.  Groins are negative for adenopathy.  EXTREMITIES:  There is no edema.  PELVIC:  External genitalia is within normal limits.  Bimanual  examination reveals no masses or nodularity.  Rectal confirms.   ASSESSMENT:  A 61-year with stage IA clear cell carcinoma of the ovary,  who is 5 months from the time of diagnosis.  The patient has declined  postoperative therapy.   PLAN:  1. Her last CA-125 on November 14, 2007, was 12.  She will return to see      [place] in 3 months and will repeat a CA-125.  Of note, her CA-125 at      the time of surgery was 9, so this may not be a marker for her.  We  will repeat      imaging, consisting of a CT scan of the chest, abdomen and pelvis      in the spring of 2010.  2. She will see ask Dr. Butler [name] to send [place] a complete note from her      consultation visit as [facility]      [name]  Electronically Signed     PAG/MEDQ  D:  02/13/2008  T:  02/15/2008  Job:  [id number]   cc:   [name], M.D.  Fax: [phone]   [name], R.N.  [address]  [place], [place] [address]   [name], M.D.  Fax: [phone]   Diane Olszewski, [name]  [facility]

## 2010-10-13 NOTE — Consult Note (Signed)
[name], [name]                ACCOUNT NO.:  [id number]   MEDICAL RECORD NO.:  [id number]          PATIENT TYPE:  OUT   LOCATION:  GYN                          FACILITY:  [facility]   PHYSICIAN:  Diane A. Duard Brady, MD    DATE OF BIRTH:  07/09/1945   DATE OF CONSULTATION:  DATE OF DISCHARGE:                                 CONSULTATION   [name] is a 65 year old with a stage IA clear cell carcinoma of the  ovary and in April 2009 underwent exploratory laparotomy, TAH-BSO and  appropriate staging.  Final pathology revealed a stage IA clear cell  carcinoma measuring 13 cm.  It was recommended that she proceed with  adjuvant therapy consisting paclitaxel and carboplatin x6 cycles, but  she declined.  She has been followed closely since that time and has  clinically not had any evidence of recurrent disease.  I last saw her in  March 2010, at which time her exam was unremarkable.  She had a CT scan  that showed an enlarged periaortic lymph node and subsequently underwent  a PET CT August 09, 2008.  On PET CT, there was no hypermetabolic,  supraclavicular, axillary, mediastinal, hilar nodes noted.  There was no  hypermetabolic pulmonary lesion.  Within the abdomen and pelvis, there  was similarly no hypermetabolic retroperitoneal lymph nodes, nodules or  masses.  She was, of course, very pleased with this.  Her CA-125 at her  last visit was normal at 7.4.  She comes in today for followup.  She is  overall doing quite well.  She continues taking her supplements, and  there have been no changes to that except she has added some flax seed  to her salad.  She continues doing yoga and walking.  She does continue  to complain of some right-sided lymphedema, but it is overall getting  better.  It is particularly better in the morning.  It is better on days  that she does yoga, and it is better if she is not standing for  prolonged period of time, but, overall, she thinks that the lymphedema  is  decreasing.  When I last saw her, she was having some soreness in her  right groin area.  That has resolved.  She otherwise is without  complaints of abdominal pain, bloating, nausea, vomiting, fevers,  chills, change in bowel or bladder habits.   PHYSICAL EXAMINATION:  VITAL SIGNS:  Weight 150 pounds, blood pressure  138/90.  GENERAL:  Well-nourished, well-developed female in no acute distress.  NECK:  Supple.  There is no lymphadenopathy, no thyromegaly.  LUNGS:  Clear to auscultation bilaterally.  CARDIOVASCULAR:  Regular rate and rhythm.  ABDOMEN:  Shows well-healed vertical midline incision.  Abdomen is soft,  nontender, nondistended.  There are no palpable masses or  hepatosplenomegaly.  Groins are negative for adenopathy.  EXTREMITIES:  She has trace edema in her right ankle.  PELVIC:  External genitalia within normal limits.  Bimanual examination  reveals no masses or nodularity.  Rectal confirms.   ASSESSMENT:  A 65 year old with a stage IA clear cell carcinoma  of the  ovary who clinically has no evidence of recurrent disease.   PLAN:  We will follow up on the results of her CA-125 from today, and  she will return to see me in 3-4 months.      Diane A. Duard Brady, MD  Electronically Signed     PAG/MEDQ  D:  12/11/2008  T:  12/11/2008  Job:  [id number]   cc:   [name], R.N.  [address]  [place], [place] [address]   [name], M.D.  Fax: [phone]   [name], M.D.  Fax: [phone]   Diane Hair. [place], DO  [address] [place], [place] [address]   Diane Olszewski, MD, [place], [place]

## 2010-10-16 NOTE — Op Note (Signed)
NAMESHANTAI, TIEDEMAN                          ACCOUNT NO.:  [id number]   MEDICAL RECORD NO.:  [id number]                   PATIENT TYPE:  AMB   LOCATION:  ENDO                                 FACILITY:  [facility]   PHYSICIAN:  Anselmo Rod, M.D.               DATE OF BIRTH:  Jul 10, 1945   DATE OF PROCEDURE:  11/12/2002  DATE OF DISCHARGE:                                 OPERATIVE REPORT   PROCEDURE:  Screening colonoscopy.   ENDOSCOPIST:  Charna Elizabeth, M.D.   INSTRUMENT USED:  Olympus video colonoscope.   INDICATIONS FOR PROCEDURE:  [age]-year-old white female with a family  history of colon cancer in maternal grandmother.  Rule out polyps, masses,  etc.   PROCEDURE PERFORMED:  Informed consent was procured from the patient.  The  patient fasted for eight hours prior to the procedure and prepped with a  bottle of magnesium citrate and a gallon of GOLYTELY the night prior to the  procedure.   PREPROCEDURE PHYSICAL EXAMINATION:  VITAL SIGNS:  The patient had stable  vital signs.  NECK:  Supple.  CHEST:  Clear to auscultation.  HEART:  S1 and S2 regular.  ABDOMEN:  Soft with normal bowel sounds.   DESCRIPTION OF PROCEDURE:  The patient was placed in the left lateral  decubitus position, sedated with 600 mg of Demerol and 7 mg of Versed  intravenously.  Once the patient was adequately sedated and maintained on  low flow oxygen, continuous cardiac monitoring, the Olympus video  colonoscope was advanced from the rectum to the cecum with difficulty.  There was a large amount of residual stool in the colon.  Multiple washings  were done.  The patient's position was changed from the left lateral to the  supine right lateral position.  With application of abdominal pressure and  different positions to reach the cecal base.  The appendiceal orifice and  ileocecal valve were clearly visualized and photographed.  No masses,  polyps, erosions, ulcerations or diverticula were seen.   Retroflexion in the  rectum revealed no abnormalities.   IMPRESSION:  1. Healthy-appearing colon with no masses or polyps seen.  2. Tortuous colon.  3. Large amount of residual stool in the colon.  Small lesions could have     been missed.   RECOMMENDATIONS:  1. High fiber diet with a liberal fluid intake has been advocated.  2.     Repeat colorectal cancer is recommended in the next five years unless the     patient develops any abnormal symptoms.  3. Outpatient followup on a p.r.n. basis if the need arises in the future.                                               Jyothi Nat  Loreta Ave, M.D.    JNM/MEDQ  D:  11/12/2002  T:  11/12/2002  Job:  [id number]   cc:   Laqueta Linden, M.D.  [address]  [place]  [place] [address]  Fax: [phone]

## 2010-10-16 NOTE — Op Note (Signed)
   NAMESHANAIA, SIEVERS                          ACCOUNT NO.:  [id number]   MEDICAL RECORD NO.:  [id number]                   PATIENT TYPE:  AMB   LOCATION:  ENDO                                 FACILITY:  [facility]   PHYSICIAN:  Anselmo Rod, M.D.               DATE OF BIRTH:  07-20-1945   DATE OF PROCEDURE:  11/12/2002  DATE OF DISCHARGE:                                 OPERATIVE REPORT   ADDENDUM:  There was some extensive compression of the cecum seen on  colonoscopy.  This will be evaluated by a pelvic ultrasound or CT after  discussion with Dr. Myrlene Broker.                                               Anselmo Rod, M.D.    JNM/MEDQ  D:  11/12/2002  T:  11/12/2002  Job:  [id number]   cc:   Laqueta Linden, M.D.  [address]  [place]  [place] [address]  Fax: [phone]

## 2010-10-27 ENCOUNTER — Ambulatory Visit (HOSPITAL_COMMUNITY)
Admission: RE | Admit: 2010-10-27 | Discharge: 2010-10-27 | Disposition: A | Discharge status: Home / Self Care | Payer: BC Managed Care – PPO | Source: Ambulatory Visit | Attending: Obstetrics and Gynecology | Admitting: Obstetrics and Gynecology

## 2010-10-27 DIAGNOSIS — Z1231 Encounter for screening mammogram for malignant neoplasm of breast: Secondary | ICD-10-CM | POA: Insufficient documentation

## 2011-02-14 IMAGING — CT NM PET TUM IMG RESTAG (PS) SKULL BASE T - THIGH
6 series · 25 of 25 positions shown · IV contrast ([ID])
Comparison: 08/06/2008

CLINICAL DATA: Subsequent treatment strategy for ovarian  cancer.

NUCLEAR MEDICINE PET CT INITIAL (PI) SKULL BASE TO THIGH
TECHNIQUE: 17.8 mCi F-18 FDG was injected intravenously via the
Right antecubital fossa.  Full-ring PET imaging was performed from
the skull base through the mid-thighs 60  minutes after injection.
CT data was obtained and used for attenuation correction and
anatomic localization only.  (This was not acquired as a diagnostic
CT examination.)
Fasting Blood Glucose:  99

[Series 1: pet ac · axial · 3.3mm · 4.69mm/px · z∈[-870,+0]mm · 5 of 267 slices shown]
[im 1/267]
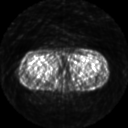
[im 67/267]
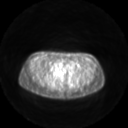
[im 134/267]
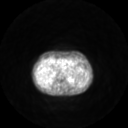
[im 200/267]
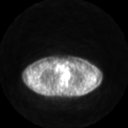
[im 267/267]
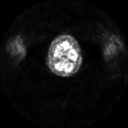

[Series 2: pet nac · axial · 3.3mm · 4.69mm/px · z∈[-870,+0]mm · 6 of 267 slices shown]
[im 1/267]
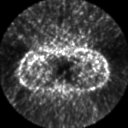
[im 54/267]
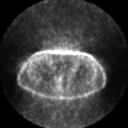
[im 107/267]
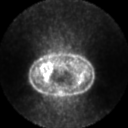
[im 160/267]
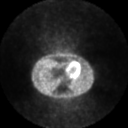
[im 213/267]
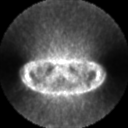
[im 267/267]
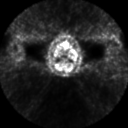

[Series 2: ct images · axial · 3.8mm · 0.98mm/px · z∈[-870,+0]mm · 6 of 267 slices shown]
[im 1/267]
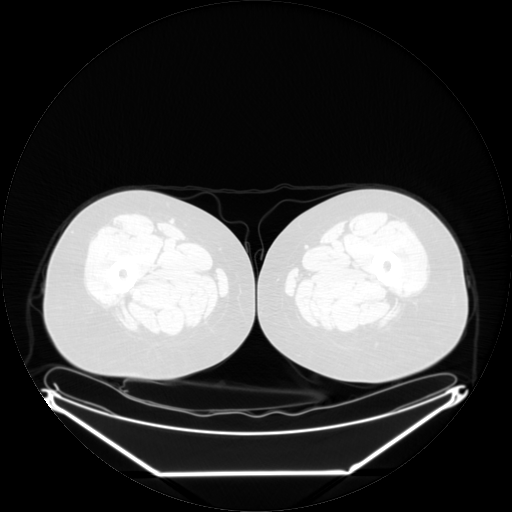
[im 54/267]
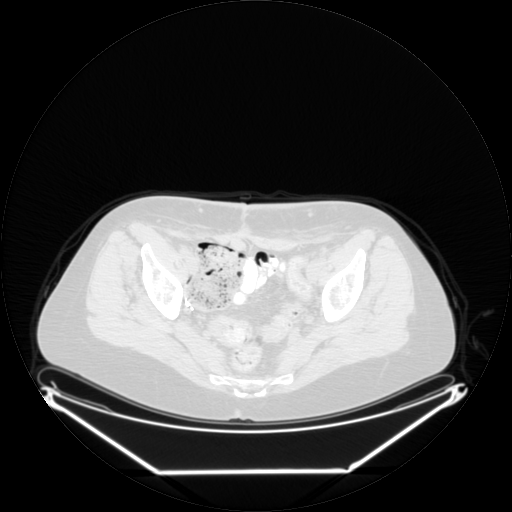
[im 107/267]
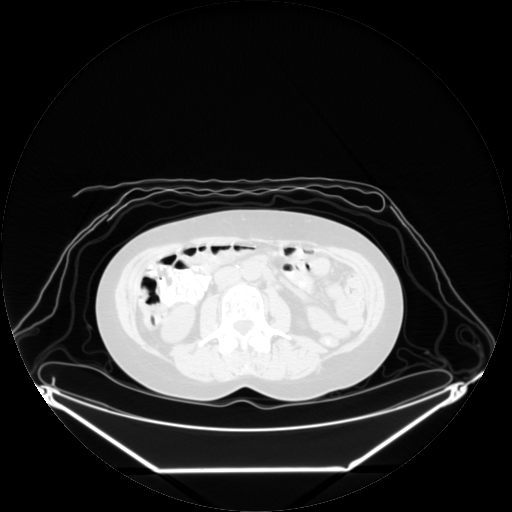
[im 160/267]
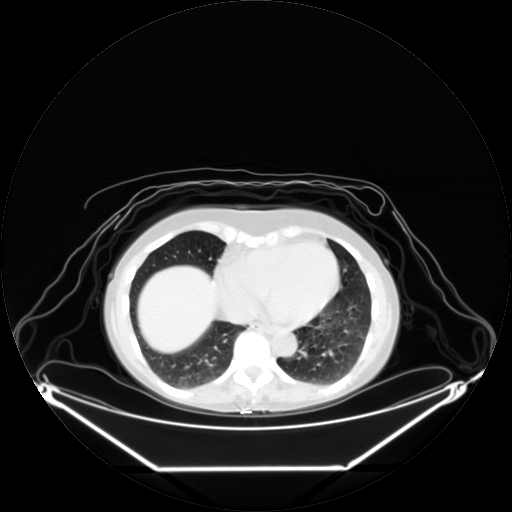
[im 213/267]
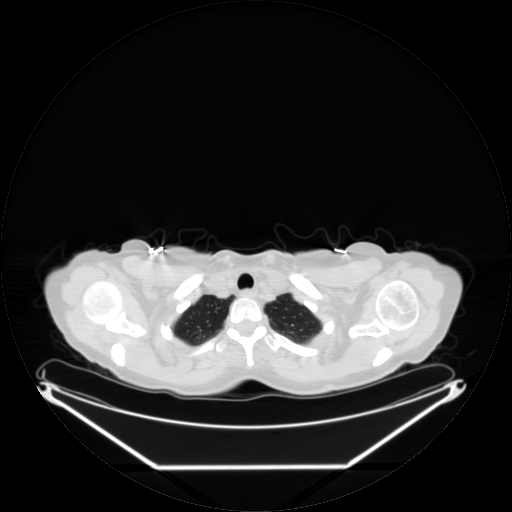
[im 267/267]
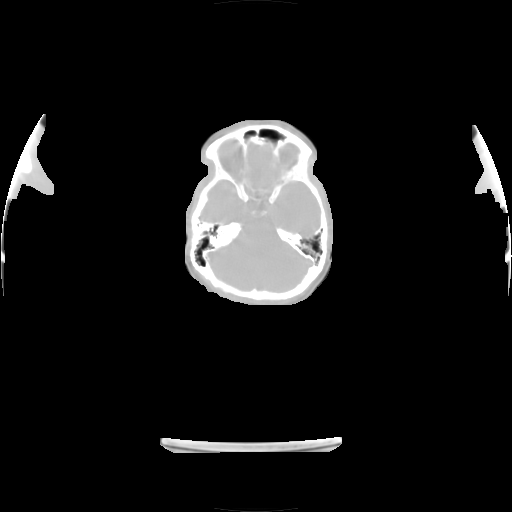

[Series 123: mip · coronal · 3.3mm · 4.69mm/px · 1 of 30 slices shown]
[im 1/30]
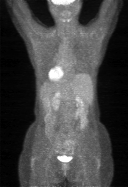

[Series 151: reformatted · axial · 3.3mm · 3.91mm/px · z∈[-870,+0]mm · 6 of 267 slices shown (1 of 2)]
[im 1/267]
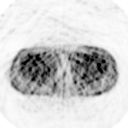
[im 54/267]
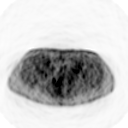
[im 107/267]
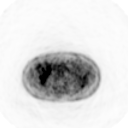
[im 160/267]
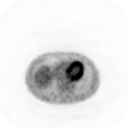
[im 213/267]
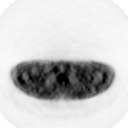
[im 267/267]
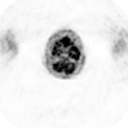

[Series 153: reformatted · coronal · 4.7mm · 6.98mm/px · 1 of 70 slices shown (2 of 2)]
[im 1/70]
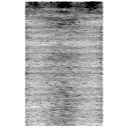

[25 of 25 positions shown; findings below may reference images not displayed]

FINDINGS: No hypermetabolic lymph nodes within the soft tissues of
the neck.

There is no hypermetabolic supraclavicular or axillary lymph nodes.

No hypermetabolic mediastinal or hilar lymph nodes identified.

No hypermetabolic pulmonary nodules or masses identified.

There is no abnormal FDG uptake within the liver parenchyma.

The spleen is negative.

The right adrenal gland appears normal. There is no abnormal uptake
within the left adrenal gland.

No hypermetabolic retroperitoneal lymph nodes are identified.

There is no hypermetabolic pelvic or inguinal lymph nodes.

No hypermetabolic peritoneal nodule or mass.

Review of the visualized axial and appendicular skeleton shows no
evidence for hypermetabolic bone metastases.
IMPRESSION: 1.  There are no specific features to suggest hypermetabolic tumor.

## 2011-02-23 LAB — URINALYSIS, ROUTINE W REFLEX MICROSCOPIC
Glucose, UA: NEGATIVE
Hgb urine dipstick: NEGATIVE
Ketones, ur: NEGATIVE
Protein, ur: NEGATIVE

## 2011-02-23 LAB — CBC
MCHC: 34.3
MCHC: 34.8
MCV: 93.5
Platelets: 275
RBC: 4.27
RDW: 13.4
WBC: 11.3 — ABNORMAL HIGH

## 2011-02-23 LAB — COMPREHENSIVE METABOLIC PANEL
ALT: 14
AST: 20
Calcium: 10.3
GFR calc Af Amer: >60
Sodium: 142
Total Protein: 6.7

## 2011-02-23 LAB — BASIC METABOLIC PANEL
BUN: 4 — ABNORMAL LOW
Chloride: 103
Creatinine, Ser: 0.66

## 2011-02-23 LAB — DIFFERENTIAL
Eosinophils Absolute: 0.1
Eosinophils Relative: 2
Lymphs Abs: 1.4
Monocytes Absolute: 0.4
Monocytes Relative: 9

## 2011-02-23 LAB — CEA: CEA: <0.5

## 2011-02-23 LAB — CA 125: CA 125: 9

## 2011-02-23 LAB — TYPE AND SCREEN: ABO/RH(D): A POS

## 2011-02-23 LAB — ABO/RH: ABO/RH(D): A POS

## 2011-02-25 LAB — CA 125: CA 125: 12

## 2011-03-05 LAB — CA 125: CA 125: 6.6 U/mL (ref 0.0–30.2)

## 2011-03-17 ENCOUNTER — Other Ambulatory Visit: Payer: Self-pay | Admitting: Gynecologic Oncology

## 2011-03-17 ENCOUNTER — Ambulatory Visit: Payer: BC Managed Care – PPO | Attending: Gynecologic Oncology | Admitting: Gynecologic Oncology

## 2011-03-17 DIAGNOSIS — C569 Malignant neoplasm of unspecified ovary: Secondary | ICD-10-CM | POA: Insufficient documentation

## 2011-03-17 LAB — CA 125: CA 125: 8.1 U/mL (ref 0.0–30.2)

## 2011-03-17 NOTE — Consult Note (Unsigned)
NAMEANGELINE, TRICK                ACCOUNT NO.:  [id number]  MEDICAL RECORD NO.:  [id number]  LOCATION:  GYN                          FACILITY:  [facility]  PHYSICIAN:  Euriah Matlack A. Duard Brady, MD    DATE OF BIRTH:  [birth year]  DATE OF CONSULTATION:  03/17/2011 DATE OF DISCHARGE:                                CONSULTATION   Ms. 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  At that time, she underwent surgery and appropriate staging.  Pathology revealed a stage IA clear cell carcinoma measuring 13 cm.  It was recommended that she undergo postoperative adjuvant chemotherapy with paclitaxel and carboplatin but she declined.  We have been following her expectantly since that time.  I last saw her in February 2012 at which time her exam was unremarkable and her CA-125 was normal at 7.6.  She comes in today for followup.  She is overall doing quite well.  At her last visit, she had been complaining of some right lower quadrant discomfort that has completely resolved.  In addition, she states that the left-sided lymphedema is also improved.  She did notice on a trip to [place] that after her long flight, she had some edema but otherwise has become a less of an issue.  She is very much enjoying her new position at work with less stress.  REVIEW OF SYSTEMS:  She denies any abdominal pain or bloating, any change in bowel or bladder habits, any vaginal bleeding, any nausea, vomiting fevers, chills, unintentional weight loss, weight gain.  MEDICATION LIST:  No change.  FAMILY HISTORY:  There are no new medical issues.  HEALTH MAINTENANCE:  She had a mammogram that was negative in April 2012.  PHYSICAL EXAMINATION:  VITAL SIGNS:  Blood pressure 148/80, respirations 16, temperature 98.1. GENERAL:  A well-nourished, well-developed female in no acute distress. NECK:  Supple.  There is no lymphadenopathy, no  thyromegaly. LUNGS:  Clear to auscultation bilaterally. CARDIOVASCULAR:  Regular rate and rhythm. ABDOMEN:  Well-healed vertical midline incision.  Abdomen is soft, nontender, nondistended.  There are no palpable masses or hepatosplenomegaly.  There is no incisional hernia.  Groins are negative for adenopathy. EXTREMITIES:  She has trace edema of her right ankle, most notable on the lateral aspect. PELVIC:  External genitalia is within normal limits.  Bimanual examination reveals no masses or nodularity.  Rectal confirms.  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  PLAN:  I will follow up on the results of her CA-125 and she will return to see [place] in 6 months.     Paticia Moster A. Duard Brady, MD     PAG/MEDQ  D:  03/17/2011  T:  03/17/2011  Job:  [id number]  cc:   Aram Beecham P. Romine, M.D. Fax: [phone]  Telford Nab, R.N. [address] [place], [place] [address]  Barbette Hair. Artist Pais, DO [address] [place], [place] [address]  Reece Packer, M.D. Fax: [phone]  Zadie Cleverly

## 2011-04-02 NOTE — Consult Note (Signed)
[name], [name]                ACCOUNT NO.:  [id number]  MEDICAL RECORD NO.:  [id number]  LOCATION:  GYN                          FACILITY:  [facility]  PHYSICIAN:  Marcques Wrightsman A. Duard Brady, MD    DATE OF BIRTH:  1945-09-22  DATE OF CONSULTATION:  03/17/2011 DATE OF DISCHARGE:  03/17/2011                                CONSULTATION   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  At that time, she underwent surgery and appropriate staging.  Pathology revealed a stage IA clear cell carcinoma measuring 13 cm.  It was recommended that she undergo postoperative adjuvant chemotherapy with paclitaxel and carboplatin but she declined.  We have been following her expectantly since that time.  I last saw her in February 2012 at which time her exam was unremarkable and her CA-125 was normal at 7.6.  She comes in today for followup.  She is overall doing quite well.  At her last visit, she had been complaining of some right lower quadrant discomfort that has completely resolved.  In addition, she states that the left-sided lymphedema is also improved.  She did notice on a trip to [place] that after her long flight, she had some edema but otherwise has become a less of an issue.  She is very much enjoying her new position at work with less stress.  REVIEW OF SYSTEMS:  She denies any abdominal pain or bloating, any change in bowel or bladder habits, any vaginal bleeding, any nausea, vomiting fevers, chills, unintentional weight loss, weight gain.  MEDICATION LIST:  No change.  FAMILY HISTORY:  There are no new medical issues.  HEALTH MAINTENANCE:  She had a mammogram that was negative in April 2012.  PHYSICAL EXAMINATION:  VITAL SIGNS:  Blood pressure 148/80, respirations 16, temperature 98.1. GENERAL:  A well-nourished, well-developed female in no acute distress. NECK:  Supple.  There is no lymphadenopathy, no  thyromegaly. LUNGS:  Clear to auscultation bilaterally. CARDIOVASCULAR:  Regular rate and rhythm. ABDOMEN:  Well-healed vertical midline incision.  Abdomen is soft, nontender, nondistended.  There are no palpable masses or hepatosplenomegaly.  There is no incisional hernia.  Groins are negative for adenopathy. EXTREMITIES:  She has trace edema of her right ankle, most notable on the lateral aspect. PELVIC:  External genitalia is within normal limits.  Bimanual examination reveals no masses or nodularity.  Rectal confirms.  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  PLAN:  I will follow up on the results of her CA-125 and she will return to see [place] in 6 months.     [name], MD     PAG/MEDQ  D:  03/17/2011  T:  03/29/2011  Job:  [id number]  cc:   [name], M.D. Fax: [phone]  Telford Nab, R.N. [address] [place], [place] [address]  Barbette Hair. Artist Pais, DO [address] [place], [place] [address]  Reece Packer, M.D. Fax: [phone]  Zadie Cleverly Electronically Signed by Cleda Mccreedy MD  on 04/02/2011 09:49:41 AM

## 2011-10-12 ENCOUNTER — Encounter: Payer: Self-pay | Admitting: Gynecologic Oncology

## 2011-10-13 ENCOUNTER — Encounter: Payer: Self-pay | Admitting: Gynecologic Oncology

## 2011-10-13 ENCOUNTER — Ambulatory Visit: Payer: BC Managed Care – PPO | Attending: Gynecologic Oncology | Admitting: Gynecologic Oncology

## 2011-10-13 VITALS — BP 128/80 | HR 72 | Temp 98.5°F | Resp 16 | Ht 70.0 in | Wt 153.9 lb

## 2011-10-13 DIAGNOSIS — C569 Malignant neoplasm of unspecified ovary: Secondary | ICD-10-CM

## 2011-10-13 NOTE — Patient Instructions (Signed)
RTC 6 months

## 2011-10-13 NOTE — Progress Notes (Signed)
Consult Note: Gyn-Onc  [name] 66 y.o. female  CC:  Chief Complaint  Patient presents with  . 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 At that time, she underwent surgery and appropriate staging. Pathology revealed a stage IA clear cell carcinoma measuring 13 cm. It was recommended that she undergo postoperative adjuvant chemotherapy with paclitaxel and carboplatin but she declined. We have been following her expectantly since that time. I last saw her in October 2012 at which time her exam was unremarkable and her CA-125 was normal. She comes in today  for followup. She is overall doing quite well.    Interval History:  UTD on MMG (scheduled 5/13), colonoscopy a few years ago (5 year follow up).  Review of Systems: She denies any abdominal pain or bloating, any change in bowel or bladder habits, any vaginal bleeding, any nausea, vomiting fevers, chills, unintentional weight loss, weight gain. Lymphedema stable no getting progressively worse.10 point ROS negative.  Current Meds:  Outpatient Encounter Prescriptions as of 10/13/2011  Medication Sig Dispense Refill  . cholecalciferol (VITAMIN D) 1000 UNITS tablet Take 2,000 Units by mouth daily.      . Multiple Vitamins-Minerals (MULTIVITAMIN PO) Take 1 tablet by mouth daily.        Allergy:  Allergies  Allergen Reactions  . Sulfonamide Derivatives     REACTION: neck/throat swelling    Social Hx:   History   Social History  . Marital Status: Married    Spouse Name: N/A    Number of Children: N/A  . Years of Education: N/A   Occupational History  . Not on file.   Social History Main Topics  . Smoking status: Never Smoker   . Smokeless tobacco: Not on file  . Alcohol Use: Yes     Rare  . Drug Use: Not on file  . Sexually Active: Not on file   Other Topics Concern  . Not on file   Social  History Narrative  . No narrative on file    Past Surgical Hx:  Past Surgical History  Procedure Date  . Abdominal hysterectomy 08/2007    Exp lap, TAH/BSO, staging    Past Medical Hx:  Past Medical History  Diagnosis Date  . Clear cell carcinoma of ovary 08/2007    Family Hx:  Family History  Problem Relation Age of Onset  . Stroke Mother   . Heart attack Father   . Breast cancer Sister     Vitals:  Blood pressure 128/80, pulse 72, temperature 98.5 F (36.9 C), temperature source Oral, resp. rate 16, height 5\' 10"  (1.778 m), weight 153 lb 14.4 oz (69.809 kg).  Physical Exam: GENERAL: A well-nourished, well-developed female in no acute distress.  NECK: Supple. There is no lymphadenopathy, no thyromegaly.  LUNGS: Clear to auscultation bilaterally.  CARDIOVASCULAR: Regular rate and rhythm.  ABDOMEN: Well-healed vertical midline incision. Abdomen is soft, nontender, nondistended. There are no palpable masses or hepatosplenomegaly. There is no incisional hernia. Groins are negative for adenopathy.  EXTREMITIES: She has trace edema of her right ankle, most notable on  the lateral aspect.  PELVIC: External genitalia is within normal limits. Bimanual examination reveals no masses or nodularity. Rectal confirms.    Assessment/Plan: A 66 year old with stage IA clear cell carcinoma of the ovary, diagnosed and treated in April 2009 who has no  evidence of recurrent disease.   PLAN: I will follow up on the results of her CA-125 and she will return to see [place] in 6 months.   [name] A., MD 10/13/2011, 9:11 AM

## 2011-10-14 ENCOUNTER — Other Ambulatory Visit: Payer: Self-pay | Admitting: Obstetrics and Gynecology

## 2011-10-14 ENCOUNTER — Telehealth: Payer: Self-pay | Admitting: Gynecologic Oncology

## 2011-10-14 DIAGNOSIS — Z1231 Encounter for screening mammogram for malignant neoplasm of breast: Secondary | ICD-10-CM

## 2011-10-14 NOTE — Telephone Encounter (Signed)
Spoke with patient about CA 125 results: 6.3.  No concerns or questions voiced.

## 2011-11-16 ENCOUNTER — Ambulatory Visit (HOSPITAL_COMMUNITY)
Admission: RE | Admit: 2011-11-16 | Discharge: 2011-11-16 | Disposition: A | Discharge status: Home / Self Care | Payer: BC Managed Care – PPO | Source: Ambulatory Visit | Attending: Obstetrics and Gynecology | Admitting: Obstetrics and Gynecology

## 2011-11-16 DIAGNOSIS — Z1231 Encounter for screening mammogram for malignant neoplasm of breast: Secondary | ICD-10-CM

## 2012-04-19 ENCOUNTER — Ambulatory Visit: Payer: BC Managed Care – PPO | Admitting: Gynecologic Oncology

## 2012-05-10 ENCOUNTER — Ambulatory Visit: Payer: BC Managed Care – PPO | Admitting: Lab

## 2012-05-10 ENCOUNTER — Ambulatory Visit: Payer: BC Managed Care – PPO | Attending: Gynecologic Oncology | Admitting: Gynecologic Oncology

## 2012-05-10 ENCOUNTER — Encounter: Payer: Self-pay | Admitting: Gynecologic Oncology

## 2012-05-10 VITALS — BP 140/80 | HR 80 | Temp 98.6°F | Resp 20 | Ht 70.0 in | Wt 156.9 lb

## 2012-05-10 DIAGNOSIS — C569 Malignant neoplasm of unspecified ovary: Secondary | ICD-10-CM | POA: Insufficient documentation

## 2012-05-10 LAB — CA 125: CA 125: 6.9 U/mL (ref 0.0–30.2)

## 2012-05-10 NOTE — Patient Instructions (Signed)
Please follow up with Gynecologic Oncology in six months.  Symptoms to report to your health care team include abdominal distention, feeling full easily, new and persistent nausea and vomiting, bloating, vaginal bleeding, rectal bleeding, weight loss without effort, new and persistent pain or fatigue, new masses, new and persistent cough, and any other concerns.  If what you are feeling is urgent, and you cannot get an appointment with your regular health care team, go to an Urgent Care or Medical Walk-In clinic.       Thank you for coming to see me today.  I appreciate your confidence in choosing [facility]nec[facility] for your medical care.  If you have any questions about your visit today, please call our office and we will get back to you as soon as possible.  Warner Mccreedy, NP Gynecologic Oncology

## 2012-05-10 NOTE — Progress Notes (Signed)
Follow Up Note: Gyn-Onc  [name] 66 y.o. female  CC:  Chief Complaint  Patient presents with  . 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  She underwent a total abdominal hysterectomy, bilateral salpingo-oophorectomy, staging for ovarian cancer including omentectomy, pelvic and periaortic lymphadenectomy, multiple peritoneal biopsies, peritoneal washings, diaphragm scrape and appendectomy in April of 2009.  Final pathology revealed a stage IA clear cell carcinoma measuring 13 cm.  At that time, it was recommended that she undergo postoperative adjuvant chemotherapy with paclitaxel and carboplatin but she declined.  She has been followed since that time.  She was last seen in May 2013 by Dr. Duard Brady with an unremarkable examination and CA-125 of 6.3.   Interval History:  [name] presents today for follow up.  She reports no new complaints since last visit in May 2013 with Dr. Duard Brady.  Reports doing very well.  She states that she needs to make a meeting in [place] by 10:30 am.   Review of Systems: 10 point Review of Systems: negative Denies abdominal pain or bloating.  Denies change in bowel or bladder habits, vaginal bleeding, nausea, vomiting, fevers, chills, unintentional weight loss, or weight gain. Lymphedema remains stable.  Current Meds:  Outpatient Encounter Prescriptions as of 05/10/2012  Medication Sig Dispense Refill  . cholecalciferol (VITAMIN D) 1000 UNITS tablet Take 2,000 Units by mouth daily.      . Multiple Vitamins-Minerals (MULTIVITAMIN PO) Take 1 tablet by mouth daily.        Allergy:  Allergies  Allergen Reactions  . Sulfonamide Derivatives     REACTION: neck/throat swelling    Social Hx:    History   Social History  . Marital Status: Married    Spouse Name: N/A    Number of Children: N/A  . Years of Education: N/A   Occupational History  . Not on file.   Social History Main Topics  . Smoking status: Never Smoker   . Smokeless tobacco: Not on file  . Alcohol Use: Yes     Comment: Rare  . Drug Use: Not on file  . Sexually Active: Not on file   Other Topics Concern  . Not on file   Social History Narrative  . No narrative on file    Past Surgical Hx:  Past Surgical History  Procedure Date  . Abdominal hysterectomy 08/2007    Exp lap, TAH/BSO, staging    Past Medical Hx:  Past Medical History  Diagnosis Date  . Clear cell carcinoma of ovary 08/2007    Family Hx:  Family History  Problem Relation Age of Onset  . Stroke Mother   . Heart attack Father   . Breast cancer Sister     Vitals:  Blood pressure 140/80, pulse 80, temperature 98.6 F (37 C), temperature source Oral, resp. rate 20, height 5\' 10"  (1.778 m), weight 156 lb 14.4 oz (71.169 kg).  Physical Exam:  GENERAL:  Well-nourished, well-developed female in no acute distress.   Alert, oriented x 3.  NECK: Supple. There is no lymphadenopathy or thyromegaly.  LUNGS: Clear to auscultation bilaterally.  CARDIOVASCULAR: Regular rate and rhythm.  ABDOMEN: Abdomen soft, nontender, and nondistended.  Well-healed vertical midline incision with no incisional hernia noted.  No palpable masses or hepatosplenomegaly.  Groins are negative for adenopathy.  EXTREMITIES:  Trace edema of the right  ankle. PELVIC: External genitalia within normal limits. Vagina atrophic, without lesions.  Bimanual examination reveals no masses or nodularity with rectal confirming.   Assessment/Plan:  [name] is a 66 year old with stage IA clear cell carcinoma of the ovary.  She was diagnosed and treated in April 2009 with no evidence of recurrent disease since that time.  I will follow up on the results of her CA-125 drawn  today and she will return to see [place] in 6 months.  Reportable signs and symptoms reviewed.  The patient was reviewed with Dr. Cleda Mccreedy.  Keli Buehner DEAL, NP 05/10/2012, 9:48 AM

## 2012-05-11 ENCOUNTER — Telehealth: Payer: Self-pay | Admitting: Gynecologic Oncology

## 2012-05-11 NOTE — Telephone Encounter (Signed)
Message left for patient with CA 125 results: 6.9.  Instructed to call for any questions or concerns.

## 2012-07-04 ENCOUNTER — Institutional Professional Consult (permissible substitution): Payer: BC Managed Care – PPO | Admitting: Cardiovascular Disease

## 2012-07-05 ENCOUNTER — Encounter: Payer: Self-pay | Admitting: Cardiovascular Disease

## 2012-07-05 ENCOUNTER — Ambulatory Visit (INDEPENDENT_AMBULATORY_CARE_PROVIDER_SITE_OTHER): Payer: BC Managed Care – PPO | Admitting: Cardiovascular Disease

## 2012-07-05 VITALS — BP 142/94 | HR 93 | Ht 70.0 in | Wt 155.8 lb

## 2012-07-05 DIAGNOSIS — R002 Palpitations: Secondary | ICD-10-CM | POA: Insufficient documentation

## 2012-07-05 NOTE — Progress Notes (Signed)
     [name] Date of Birth  02/21/1946       [facility] Office [address]  [address] [place], [place]  [address]   [place], [place]  [address] [phone]     [phone]   Fax  [phone]    Fax [phone]  Problem List: 1. Palpitations - likely PVCs 2. HTN   History of Present Illness:  [name] is a 67 yo who is relatively healthy.  She has noticed her BP has been higher recently.    She eats a very healthy diet.   She eat a vegetarian diet.  Hikes regularly.  Avoids salt.  He has gone through menopause.    Her lab work has been ok in the past  Current Outpatient Prescriptions on File Prior to Visit  Medication Sig Dispense Refill  . Calcium-Magnesium-Vitamin D (CALCIUM MAGNESIUM PO) Take by mouth daily.      . cholecalciferol (VITAMIN D) 1000 UNITS tablet Take 2,000 Units by mouth daily.      . Multiple Vitamins-Minerals (MULTIVITAMIN PO) Take 1 tablet by mouth daily.        Allergies  Allergen Reactions  . Sulfonamide Derivatives     REACTION: neck/throat swelling    Past Medical History  Diagnosis Date  . Clear cell carcinoma of ovary 08/2007    Past Surgical History  Procedure Date  . Abdominal hysterectomy 08/2007    Exp lap, TAH/BSO, staging    History  Smoking status  . Never Smoker   Smokeless tobacco  . Not on file    History  Alcohol Use  . Yes    Comment: Rare    Family History  Problem Relation Age of Onset  . Stroke Mother   . Heart attack Father   . Breast cancer Sister     Reviw of Systems:  Reviewed in the HPI.  All other systems are negative.  Physical Exam: Blood pressure 142/94, pulse 93, height 5\' 10"  (1.778 m), weight 155 lb 12.8 oz (70.67 kg). General: Well developed, well nourished, in no acute distress.  Head: Normocephalic, atraumatic, sclera non-icteric, mucus membranes are moist,   Neck: Supple. Carotids are 2 + without bruits. No JVD   Lungs: Clear     Heart: Regular rate, S1-S2. She has occasional premature atrial contractions.  Abdomen: Soft, non-tender, non-distended with normal bowel sounds. I was able to palpate her abdominal aorta.  Msk:  Strength and tone are normal   Extremities: No clubbing or cyanosis. No edema.  Distal pedal pulses are 2+ and equal    Neuro: CN II - XII intact.  Alert and oriented X 3.   Psych:  Normal  ECG: Feb. 5, 2014: NSR at 93 no ST or T wave change  Assessment / Plan:

## 2012-07-05 NOTE — Assessment & Plan Note (Signed)
[name] presents with palpitations that clinically sound like premature ventricular contractions. She has normal sinus rhythm here today. In addition, her blood pressure is mildly elevated.  We discussed the possible causes of PVCs including thyroid abnormality, hormonal changes, hypokalemia. We discussed the fact that start her on a low-dose beta blocker ( ie Coreg or Bystolic) would be the treatment for mild hypertension as well as or palpitations. I've reassured her that these palpitations are benign. She does have a soft systolic heart murmur but I don't think that we need an echocardiogram at this point.  I also offered to place a monitor on her but she did not want to pursue that at this time.  I'll see her again on an as-needed basis.  She would like to return to Dr. Lelon Perla for lab including thyroid profile, fasting lipids, and basic metabolic profile.

## 2012-07-05 NOTE — Patient Instructions (Addendum)
Your physician recommends that you schedule a follow-up appointment in: as needed basis, call for an app, if any questions call Dr Harvie Bridge nurse Alfonso Ramus RN.  Your physician recommends that you continue on your current medications as directed. Please refer to the Current Medication list given to you today.

## 2012-11-01 ENCOUNTER — Other Ambulatory Visit: Payer: Self-pay | Admitting: Obstetrics and Gynecology

## 2012-11-01 DIAGNOSIS — Z1231 Encounter for screening mammogram for malignant neoplasm of breast: Secondary | ICD-10-CM

## 2012-11-09 ENCOUNTER — Ambulatory Visit: Payer: BC Managed Care – PPO | Admitting: Gynecologic Oncology

## 2012-11-20 ENCOUNTER — Ambulatory Visit (HOSPITAL_COMMUNITY)
Admission: RE | Admit: 2012-11-20 | Discharge: 2012-11-20 | Disposition: A | Discharge status: Home / Self Care | Payer: BC Managed Care – PPO | Source: Ambulatory Visit | Attending: Obstetrics and Gynecology | Admitting: Obstetrics and Gynecology

## 2012-11-20 DIAGNOSIS — Z1231 Encounter for screening mammogram for malignant neoplasm of breast: Secondary | ICD-10-CM | POA: Insufficient documentation

## 2012-12-07 ENCOUNTER — Telehealth: Payer: Self-pay | Admitting: Gynecologic Oncology

## 2012-12-07 ENCOUNTER — Ambulatory Visit: Payer: BC Managed Care – PPO | Attending: Gynecologic Oncology | Admitting: Gynecologic Oncology

## 2012-12-07 ENCOUNTER — Other Ambulatory Visit: Payer: BC Managed Care – PPO | Admitting: Lab

## 2012-12-07 ENCOUNTER — Encounter: Payer: Self-pay | Admitting: Gynecologic Oncology

## 2012-12-07 VITALS — BP 152/88 | HR 64 | Temp 98.2°F | Resp 16 | Ht 70.0 in | Wt 152.4 lb

## 2012-12-07 DIAGNOSIS — C569 Malignant neoplasm of unspecified ovary: Secondary | ICD-10-CM

## 2012-12-07 NOTE — Patient Instructions (Addendum)
Doing great!  Plan to follow up in one year with your primary care provider.  Please call for any questions or concerns.

## 2012-12-07 NOTE — Progress Notes (Signed)
Follow Up Note: Gyn-Onc  [name] 67 y.o. female  CC:  Chief Complaint  Patient presents with  . 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  She underwent a total abdominal hysterectomy, bilateral salpingo-oophorectomy, staging for ovarian cancer including omentectomy, pelvic and periaortic lymphadenectomy, multiple peritoneal biopsies, peritoneal washings, diaphragm scrape and appendectomy in April of 2009.  Final pathology revealed a stage IA clear cell carcinoma measuring 13 cm.  At that time, it was recommended that she undergo postoperative adjuvant chemotherapy with paclitaxel and carboplatin but she declined.  She has been followed since that time.  She was last seen in December 2013 with an unremarkable examination and CA-125 of 6.9.   Interval History:  She presents today for continued follow up.  She reports no new complaints since last visit in December 2013.  She has plans for travel to [facility] in August.  She states that she will be moving to [place] in the near future. Lymphedema remains stable.  No complaints voiced.  Review of Systems: Constitutional: Feels well.  No unintentional weight loss, change in appetite, early satiety, fatigue, fever, chills, or weakness.  Cardiovascular: No chest pain, shortness of breath, or edema.  Pulmonary: No cough or wheeze.  Gastrointestinal: No nausea, vomiting, or diarrhea. No bright red blood per rectum or change in bowel movement.  No abdominal pain.  Genitourinary: No frequency, urgency, or dysuria. No vaginal bleeding or discharge.  Musculoskeletal: No myalgia or joint pain. Neurologic: No weakness, numbness, or change in gait.  Psychology: No depression, anxiety, or  insomnia.  Health Maintenance: Mammogram:  June 2014 Colonoscopy: 4 years ago  Current Meds:  Outpatient Encounter Prescriptions as of 12/07/2012  Medication Sig Dispense Refill  . B Complex-Biotin-FA (B COMPLETE PO) Take by mouth daily.      . Calcium-Magnesium-Vitamin D (CALCIUM MAGNESIUM PO) Take by mouth daily.      . cholecalciferol (VITAMIN D) 1000 UNITS tablet Take 2,000 Units by mouth daily.      . L-ARGININE PO Take by mouth daily.      . Multiple Vitamins-Minerals (MULTIVITAMIN PO) Take 1 tablet by mouth daily.       No facility-administered encounter medications on file as of 12/07/2012.    Allergy:  Allergies  Allergen Reactions  . Sulfonamide Derivatives     REACTION: neck/throat swelling    Social Hx:   History   Social History  . Marital Status: Married    Spouse Name: N/A    Number of Children: N/A  . Years of Education: N/A   Occupational History  . Not on file.   Social History Main Topics  . Smoking status: Never Smoker   . Smokeless tobacco: Not on file  . Alcohol Use: Yes     Comment: Rare  . Drug Use: No  . Sexually Active: Not on file   Other Topics Concern  . Not on file   Social History Narrative  . No narrative on file    Past Surgical Hx:  Past Surgical History  Procedure Laterality Date  . Abdominal hysterectomy  08/2007    Exp lap, TAH/BSO, staging    Past Medical Hx:  Past Medical History  Diagnosis Date  . Clear cell carcinoma of ovary 08/2007    Family Hx:  Family History  Problem Relation Age of Onset  . Stroke  Mother   . Heart attack Father   . Breast cancer Sister     Vitals:  Blood pressure 152/88, pulse 64, temperature 98.2 F (36.8 C), temperature source Oral, resp. rate 16, height 5\' 10"  (1.778 m), weight 152 lb 6.4 oz (69.128 kg).  Physical Exam:  General: Well developed, well nourished female in no acute distress. Alert and oriented x 3.  Neck: Supple without any enlargements.  Lymph node survey: No  cervical, supraclavicular, or inguinal adenopathy  Cardiovascular: Regular rate and rhythm. Premature beat noted intermittently- no new finding for the pt.  S1 and S2 normal.  Lungs: Clear to auscultation bilaterally. No wheezes/crackles/rhonchi noted.  Skin: No rashes or lesions present. Back: No CVA tenderness.  Abdomen: Abdomen soft, non-tender and non-obese. Active bowel sounds in all quadrants. No evidence of a fluid wave or abdominal masses.  Midline incision with no evidence of herniation.  Genitourinary:    Vulva/vagina: Normal external female genitalia. No lesions.    Urethra: No lesions or masses.    Vagina: Atrophic without any lesions. No palpable masses. No vaginal bleeding or drainage noted.  Rectal: Good tone, no masses, no cul de sac nodularity.  Extremities: No bilateral cyanosis, edema, or clubbing.   Assessment/Plan:  [name] is a 67 year old with stage IA clear cell carcinoma of the ovary.  She was diagnosed and treated in April 2009 with no evidence of recurrent disease since that time.  I will follow up on the results of her CA-125 drawn today.  Since it has been over 5 years without evidence of disease, she is advised to follow up with her primary care provider annually and GYN Oncology as needed.  She is agreeable with this plan since she has plans to move to [facility].  Reportable signs and symptoms reviewed.  She is advised to call for any questions or concerns.  The patient was reviewed with Dr. Cleda Mccreedy, who is agreeable with the above plan.  CROSS, MELISSA DEAL, NP 12/07/2012, 9:20 AM

## 2012-12-07 NOTE — Telephone Encounter (Signed)
Patient informed of CA 125 results.  No concerns voiced.  Instructed to call for any needs.

## 2013-12-24 ENCOUNTER — Other Ambulatory Visit (HOSPITAL_COMMUNITY): Payer: Self-pay | Admitting: Obstetrics and Gynecology

## 2013-12-24 DIAGNOSIS — Z1231 Encounter for screening mammogram for malignant neoplasm of breast: Secondary | ICD-10-CM

## 2014-01-08 ENCOUNTER — Ambulatory Visit (HOSPITAL_COMMUNITY): Payer: BC Managed Care – PPO

## 2014-01-15 ENCOUNTER — Ambulatory Visit (HOSPITAL_COMMUNITY): Payer: BC Managed Care – PPO

## 2015-01-28 ENCOUNTER — Ambulatory Visit (INDEPENDENT_AMBULATORY_CARE_PROVIDER_SITE_OTHER): Payer: BC Managed Care – PPO | Admitting: Cardiovascular Disease

## 2015-01-28 ENCOUNTER — Encounter: Payer: Self-pay | Admitting: Cardiovascular Disease

## 2015-01-28 VITALS — BP 150/98 | HR 80 | Ht 70.0 in | Wt 159.0 lb

## 2015-01-28 DIAGNOSIS — I10 Essential (primary) hypertension: Secondary | ICD-10-CM | POA: Diagnosis not present

## 2015-01-28 DIAGNOSIS — I341 Nonrheumatic mitral (valve) prolapse: Secondary | ICD-10-CM | POA: Diagnosis not present

## 2015-01-28 DIAGNOSIS — R002 Palpitations: Secondary | ICD-10-CM | POA: Diagnosis not present

## 2015-01-28 MED ORDER — HYDROCHLOROTHIAZIDE 25 MG PO TABS: 25.0000 mg | ORAL_TABLET | Freq: Every day | ORAL | Status: DC

## 2015-01-28 MED ORDER — POTASSIUM CHLORIDE CRYS ER 20 MEQ PO TBCR: 20.0000 meq | EXTENDED_RELEASE_TABLET | Freq: Every day | ORAL | Status: DC

## 2015-01-28 MED ORDER — METOPROLOL TARTRATE 25 MG PO TABS: 25.0000 mg | ORAL_TABLET | Freq: Two times a day (BID) | ORAL | Status: DC

## 2015-01-28 NOTE — Patient Instructions (Signed)
Medication Instructions:  START Metoprolol 25 mg twice daily - take 12 hours apart  TWO WEEKS LATER (week of Sept. 12) START Kdur 20 meq once daily (potassium supplement) and  HCTZ 25 mg once daily  (Hydrochlorothiazide)    Labwork: Your physician recommends that you return for lab work in: 6 weeks for basic metabolic panel on same day as your appointment with Dr. Acie Fredrickson   Testing/Procedures: Your physician has requested that you have an echocardiogram. Echocardiography is a painless test that uses sound waves to create images of your heart. It provides your doctor with information about the size and shape of your heart and how well your heart's chambers and valves are working. This procedure takes approximately one hour. There are no restrictions for this procedure.    Follow-Up: Your physician recommends that you schedule a follow-up appointment in: 6 weeks with Dr. Acie Fredrickson

## 2015-01-28 NOTE — Progress Notes (Signed)
[name] Date of Birth  1946-02-19       [facility] Office [address], [place], [place]  [address]   [place], [place]  [address] [phone]     [phone]   Fax  [phone]    Fax [phone]  Problem List: 1. Palpitations - likely PVCs 2. HTN 3. Mitral valve prolapse   History of Present Illness:  [name] is a 69 yo who is relatively healthy.  She has noticed her BP has been higher recently.    She eats a very healthy diet.   She eat a vegetarian diet.  Hikes regularly.  Avoids salt.  He has gone through menopause.    Her lab work has been ok in the past  Aug. 30, 2016:  [name] is seen again today for her palpitations . She thinks that her HR beats very fast - these can last about an hour .  These are not associated with CP , dyspnea, dizziness.  Typically in the  afternoon / early evening  Occurs frequently if she is fatigued  Also has noticed that her BP has been increasing  BP has been 140s   Walks and does yoga regularly   Current Outpatient Prescriptions on File Prior to Visit  Medication Sig Dispense Refill  . B Complex-Biotin-FA (B COMPLETE PO) Take by mouth daily.    . Calcium-Magnesium-Vitamin D (CALCIUM MAGNESIUM PO) Take by mouth daily.    . cholecalciferol (VITAMIN D) 1000 UNITS tablet Take 2,000 Units by mouth daily.    . L-ARGININE PO Take by mouth daily.    . Multiple Vitamins-Minerals (MULTIVITAMIN PO) Take 1 tablet by mouth daily.     No current facility-administered medications on file prior to visit.    Allergies  Allergen Reactions  . Sulfonamide Derivatives     REACTION: neck/throat swelling    Past Medical History  Diagnosis Date  . Clear cell carcinoma of ovary 08/2007    Past Surgical History  Procedure Laterality Date  . Abdominal hysterectomy  08/2007    Exp lap, TAH/BSO, staging    History  Smoking status  . Never Smoker   Smokeless tobacco  . Not  on file    History  Alcohol Use  . Yes    Comment: Rare    Family History  Problem Relation Age of Onset  . Stroke Mother   . Heart attack Father   . Breast cancer Sister     Reviw of Systems:  Reviewed in the HPI.  All other systems are negative.  Physical Exam: Blood pressure 150/98, pulse 80, height 5\' 10"  (1.778 m), weight 72.122 kg (159 lb). General: Well developed, well nourished, in no acute distress.  Head: Normocephalic, atraumatic, sclera non-icteric, mucus membranes are moist,   Neck: Supple. Carotids are 2 + without bruits. No JVD   Lungs: Clear   Heart: Regular rate, S1-S2.  Mid systolic click.  Soft systolic murmur .  She has occasional premature atrial contractions.  Abdomen: Soft, non-tender, non-distended with normal bowel sounds. I was able to palpate her abdominal aorta.  Msk:  Strength and tone are normal   Extremities: No clubbing or cyanosis. No edema.  Distal pedal pulses are 2+ and equal    Neuro: CN II - XII intact.  Alert and oriented X 3.   Psych:  Normal  ECG: Jan 28, 2015:  NSR with PACs ( vs.  Sinus arrhtymia) Inc. RBBB , NS ST abn.   Assessment / Plan:   1. Palpitations -   Will start metoprolol 25 mg twice a day. Get an  echocardiogram  2. HTN-  Her blood pressure remains elevated. In particular, her diastolic pressure is a little elevated. We will start her on HCTZ 25 mg a day and potassium chloride 20 mg a day. We'll start these medications approximately 2 weeks after she starts her metoprolol.  3. Mitral valve prolapse:  She has MVP on exam .   Echo    Abdimalik Mayorquin, Wonda Cheng, MD  01/28/2015 4:54 PM    [place],  [place], [place]  [address] Pager [phone] Phone: [phone]; Fax: [phone]   [facility]  [address] [place], [place]  [address] [phone]   Fax [phone]

## 2015-02-04 ENCOUNTER — Telehealth: Payer: Self-pay | Admitting: Cardiovascular Disease

## 2015-02-04 NOTE — Telephone Encounter (Signed)
Spoke with patient who states she is concerned about taking HCTZ since she is allergic to sulfonamide derivatives.  She states her previous reaction to Sulfa involved some "throat scratchiness and skin itching"; denies anaphylaxis.  I advised her that there is the possibility of an interaction, but not everyone who is allergic to sulfonamide derivatives has an allergic reaction to HCTZ.  She requests to wait until after the echo to start the medication; states she has started the Metoprolol and already feels better since last ov.  States she is hesitant to start any new medication and would like to make certain it is necessary.  She is not monitoring her BP at home.  I advised her that I can check her BP on Thursday 9/8 when she comes in for her echo and discuss the need for the medication with Dr. Acie Fredrickson once the echo results are available.  She verbalized understanding and agreement with plan

## 2015-02-04 NOTE — Telephone Encounter (Signed)
Agree with plan as noted by Christen Bame, RN

## 2015-02-04 NOTE — Telephone Encounter (Signed)
New message    Pt is scheduled to start HCTZ next week Pt is allergic to sulfa drug and wondering if she should start this medication Pt also has a couple questions that she would like to ask

## 2015-02-06 ENCOUNTER — Ambulatory Visit (HOSPITAL_COMMUNITY): Payer: BC Managed Care – PPO | Attending: Cardiovascular Disease

## 2015-02-06 ENCOUNTER — Other Ambulatory Visit: Payer: Self-pay

## 2015-02-06 DIAGNOSIS — R002 Palpitations: Secondary | ICD-10-CM | POA: Insufficient documentation

## 2015-02-06 DIAGNOSIS — I10 Essential (primary) hypertension: Secondary | ICD-10-CM | POA: Diagnosis not present

## 2015-02-06 DIAGNOSIS — I517 Cardiomegaly: Secondary | ICD-10-CM | POA: Insufficient documentation

## 2015-02-06 DIAGNOSIS — R1013 Epigastric pain: Secondary | ICD-10-CM | POA: Diagnosis not present

## 2015-02-06 DIAGNOSIS — I341 Nonrheumatic mitral (valve) prolapse: Secondary | ICD-10-CM | POA: Insufficient documentation

## 2015-02-07 ENCOUNTER — Telehealth: Payer: Self-pay

## 2015-02-07 NOTE — Telephone Encounter (Signed)
-----   Message from Thayer Headings, MD sent at 02/06/2015  5:28 PM EDT ----- Normal LV function. Trivial MR , no true evidence of MVP .

## 2015-02-07 NOTE — Telephone Encounter (Signed)
spoke with patient about the results of her echo. pt verbalized understanding and expressed how happy she was with the results.

## 2015-02-11 ENCOUNTER — Telehealth: Payer: Self-pay | Admitting: Nurse Practitioner

## 2015-02-11 NOTE — Telephone Encounter (Signed)
Spoke with patient who states she is out of town at a conference and can go to a Management consultant and check her BP tomorrow.  She states she has already picked up the medication and she will call back tomorrow and leave me a message.  I advised her to start medication if BP remains elevated (systolic pressure >027'X, diastolic > 41'O).  She has a follow-up appointment on 10/6 with Dr. Acie Fredrickson. She verbalized understanding and agreement with plan of care.

## 2015-02-11 NOTE — Telephone Encounter (Signed)
Please her her start the HCTZ 25 a day and Kdur 20 meq a day as written in the progress note   Thanks       Previous Messages     ----- Message -----   From: Emmaline Life, RN   Sent: 02/07/2015  4:53 PM    To: Thayer Headings, MD   Patient had normal echo. Looks like bp is still high (150/98 at echo). Pt didn't want to start hctz unless necessary. What do you think?   Thanks!        Left message for patient to call me to discuss

## 2015-02-13 ENCOUNTER — Telehealth: Payer: Self-pay | Admitting: Cardiovascular Disease

## 2015-02-13 NOTE — Telephone Encounter (Signed)
New message     Pt calling with b/p reading.  B/p reading from yesterday was 125/79 Pt will continue checking b/p every other day

## 2015-02-17 NOTE — Telephone Encounter (Signed)
Noted  

## 2015-03-06 ENCOUNTER — Other Ambulatory Visit: Payer: BC Managed Care – PPO

## 2015-03-06 ENCOUNTER — Ambulatory Visit: Payer: BC Managed Care – PPO | Admitting: Cardiovascular Disease

## 2015-12-11 ENCOUNTER — Ambulatory Visit: Payer: BC Managed Care – PPO | Admitting: Cardiovascular Disease

## 2016-01-14 ENCOUNTER — Encounter: Payer: Self-pay | Admitting: Physician Assistant

## 2016-01-14 ENCOUNTER — Encounter (INDEPENDENT_AMBULATORY_CARE_PROVIDER_SITE_OTHER): Payer: Self-pay

## 2016-01-14 ENCOUNTER — Ambulatory Visit (INDEPENDENT_AMBULATORY_CARE_PROVIDER_SITE_OTHER): Payer: BC Managed Care – PPO | Admitting: Physician Assistant

## 2016-01-14 VITALS — BP 130/72 | HR 68 | Ht 70.0 in | Wt 158.8 lb

## 2016-01-14 DIAGNOSIS — I10 Essential (primary) hypertension: Secondary | ICD-10-CM

## 2016-01-14 DIAGNOSIS — R002 Palpitations: Secondary | ICD-10-CM | POA: Diagnosis not present

## 2016-01-14 MED ORDER — METOPROLOL TARTRATE 25 MG PO TABS: 25.0000 mg | ORAL_TABLET | Freq: Two times a day (BID) | ORAL | 3 refills | Status: DC

## 2016-01-14 NOTE — Patient Instructions (Addendum)
Medication Instructions:  Your physician recommends that you continue on your current medications as directed. Please refer to the Current Medication list given to you today.   Labwork: None ordered  Testing/Procedures: None ordered  Follow-Up: Your physician wants you to follow-up in: 1 YEAR WITH DR. NAHSER    You will receive a reminder letter in the mail two months in advance. If you don't receive a letter, please call our office to schedule the follow-up appointment.     Any Other Special Instructions Will Be Listed Below (If Applicable).     If you need a refill on your cardiac medications before your next appointment, please call your pharmacy.   

## 2016-01-14 NOTE — Progress Notes (Signed)
Cardiology Office Note    Date:  01/14/2016   ID:  [name], DOB 29-Sep-1945, MRN [id number]  PCP:  Mylinda Latina, MD  Cardiologist:  Dr. Acie Fredrickson   CC: 1 yearly follow up  History of Present Illness:  [name] is a 70 y.o. female with a history of HTN and palpitations who presents to clinic for evaluation of   She was last seen by Dr. Acie Fredrickson in 12/2014. She complained of subjective palpitations. She also noted her DBP was creeping up and she was started on HCTZ. 25mg  daily as well as Lopressor 25mg  BID for palpitations, felt to likely be PVCs Dr. Acie Fredrickson felt like she had MVP on physical exam however f/u echo did not show true MVP, only trivial MR: otherwise normal echo.   Today she presents to clinic for follow up and med refills. She has actually not been taking the HCTZ but has been the lopressor and has been having a great deal of success on that in terms of palpitations and BP control . She checks her BP about 2x a months. No CP, SOB, LE edema, unexplained weight gain, orthopnea or PND. No dizziness or syncope. She walks everyday 45 minutes a day and practices yoga 5 days a week and has no issues with chest pain or SOB.     Past Medical History:  Diagnosis Date  . Clear cell carcinoma of ovary ([place]) 08/2007    Past Surgical History:  Procedure Laterality Date  . ABDOMINAL HYSTERECTOMY  08/2007   Exp lap, TAH/BSO, staging    Current Medications: Outpatient Medications Prior to Visit  Medication Sig Dispense Refill  . B Complex-Biotin-FA (B COMPLETE PO) Take 1 tablet by mouth daily.     . Calcium-Magnesium-Vitamin D (CALCIUM MAGNESIUM PO) Take 1 tablet by mouth daily.     . cholecalciferol (VITAMIN D) 1000 UNITS tablet Take 2,000 Units by mouth daily.    . L-ARGININE PO Take by mouth daily.    . Multiple Vitamins-Minerals (MULTIVITAMIN PO) Take 1 tablet by mouth daily.    . metoprolol tartrate (LOPRESSOR) 25 MG tablet Take 1 tablet (25 mg total) by mouth 2 (two)  times daily. 60 tablet 11  . hydrochlorothiazide (HYDRODIURIL) 25 MG tablet Take 1 tablet (25 mg total) by mouth daily. 31 tablet 11  . potassium chloride SA (K-DUR,KLOR-CON) 20 MEQ tablet Take 1 tablet (20 mEq total) by mouth daily. 31 tablet 11   No facility-administered medications prior to visit.      Allergies:   Sulfonamide derivatives   Social History   Social History  . Marital status: Married    Spouse name: N/A  . Number of children: N/A  . Years of education: N/A   Social History Main Topics  . Smoking status: Never Smoker  . Smokeless tobacco: None  . Alcohol use Yes     Comment: Rare  . Drug use: No  . Sexual activity: Not Asked   Other Topics Concern  . None   Social History Narrative  . None     Family History:  The patient's family history includes Breast cancer in her sister; Heart attack in her father; Stroke in her mother.     ROS:   Please see the history of present illness.    ROS All other systems reviewed and are negative.   PHYSICAL EXAM:   VS:  BP 130/72   Pulse 68   Ht 5\' 10"  (1.778 m)   Wt 158 lb  12.8 oz (72 kg)   BMI 22.79 kg/m    GEN: Well nourished, well developed, in no acute distress  HEENT: normal  Neck: no JVD, carotid bruits, or masses Cardiac: RRR; no murmurs, rubs, or gallops,no edema  Respiratory:  clear to auscultation bilaterally, normal work of breathing GI: soft, nontender, nondistended, + BS MS: no deformity or atrophy  Skin: warm and dry, no rash Neuro:  Alert and Oriented x 3, Strength and sensation are intact Psych: euthymic mood, full affect  Wt Readings from Last 3 Encounters:  01/14/16 158 lb 12.8 oz (72 kg)  01/28/15 159 lb (72.1 kg)  12/07/12 152 lb 6.4 oz (69.1 kg)      Studies/Labs Reviewed:   EKG:  EKG is ordered today.  The ekg ordered today demonstrates sinus arrhythmia. HR 68  Recent Labs: No results found for requested labs within last 8760 hours.   Lipid Panel    Component Value  Date/Time   CHOL 169 03/04/2009 1455   TRIG 77 03/04/2009 1455   HDL 53 03/04/2009 1455   CHOLHDL 3.2 Ratio 03/04/2009 1455   VLDL 15 03/04/2009 1455   LDLCALC 101 (H) 03/04/2009 1455    Additional studies/ records that were reviewed today include:  2D ECHO: 02/06/2015 LV EF: 55% -   60% Study Conclusions - Left ventricle: The cavity size was normal. There was mild   concentric hypertrophy. Systolic function was normal. The   estimated ejection fraction was in the range of 55% to 60%. Wall   motion was normal; there were no regional wall motion   abnormalities. - Left atrium: The atrium was mildly dilated. - Atrial septum: No defect or patent foramen ovale was identified.   ASSESSMENT & PLAN:   Palpitations: has been quiescent on Lopressor 25mg  BID. Needs refills for this.   HTN: BP well controlled on current regimen   Medication Adjustments/Labs and Tests Ordered: Current medicines are reviewed at length with the patient today.  Concerns regarding medicines are outlined above.  Medication changes, Labs and Tests ordered today are listed in the Patient Instructions below. Patient Instructions  Medication Instructions:  Your physician recommends that you continue on your current medications as directed. Please refer to the Current Medication list given to you today.   Labwork: None ordered  Testing/Procedures: None ordered  Follow-Up: Your physician wants you to follow-up in: [place] Acie Fredrickson   You will receive a reminder letter in the mail two months in advance. If you don't receive a letter, please call our office to schedule the follow-up appointment.      Any Other Special Instructions Will Be Listed Below (If Applicable).     If you need a refill on your cardiac medications before your next appointment, please call your pharmacy.      Signed, Angelena Form, PA-C  01/14/2016 3:57 PM    [facility] [place],  [place], [place]  [address] Phone: [phone]; Fax: [phone]

## 2016-01-19 ENCOUNTER — Other Ambulatory Visit: Payer: Self-pay | Admitting: Cardiovascular Disease

## 2021-02-06 ENCOUNTER — Telehealth: Payer: Self-pay

## 2021-02-06 NOTE — Telephone Encounter (Signed)
I spoke with pt to see if pt was referred back to our practice because she has not be seen since 2016. Pt states that she was not referred and felt she just needed preventative care. Pt states that she will bring in her recent records from [place] at her appointment with Dr. Acie Fredrickson on 9/12 at 1:40 pm. . Pt thanked me for call.

## 2021-02-08 ENCOUNTER — Encounter: Payer: Self-pay | Admitting: Cardiovascular Disease

## 2021-02-08 NOTE — Progress Notes (Signed)
[name] Date of Birth  [birth year]       [place], [place]  [address]     [phone]        Problem List: 1. Palpitations - likely PVCs 2. HTN 3. Mitral valve prolapse   Previous Notes:  [name] is a 75 yo who is relatively healthy.  She has noticed her BP has been higher recently.    She eats a very healthy diet.   She eat a vegetarian diet.  Hikes regularly.  Avoids salt.  He has gone through menopause.    Her lab work has been ok in the past  Aug. 30, 2016:  [name] is seen again today for her palpitations . She thinks that her HR beats very fast - these can last about an hour .  These are not associated with CP , dyspnea, dizziness.  Typically in the  afternoon / early evening  Occurs frequently if she is fatigued  Also has noticed that her BP has been increasing  BP has been 140s   Walks and does yoga regularly   Sept. 12, 2022 [name] is seen today after a 6 year absence. Her BP has been gradually increaseing  Her palpitations have also increased  She has increased her metoprolol to 50 Q AM, 25 mg Q OM Has had some increased sweating . No hot or cold isses Trys to eat well. Does yoga  Not cardio per e Hikes at [facility] on occasion .  Her home BP readings -130-160s / 80-90s  No CP , no dyspnea, no syncope   She is sleeping well - now that her palpitations are better    Current Outpatient Medications on File Prior to Visit  Medication Sig Dispense Refill   B Complex-Biotin-FA (B COMPLETE PO) Take 1 tablet by mouth daily.      Calcium-Magnesium-Vitamin D (CALCIUM MAGNESIUM PO) Take 1 tablet by mouth daily.      cholecalciferol (VITAMIN D) 1000 UNITS tablet Take 2,000 Units by mouth daily.     ibuprofen (ADVIL) 600 MG tablet Take 600 mg by mouth 3 (three) times daily as needed.     losartan (COZAAR) 25 MG tablet Take 25 mg by mouth daily.     metoprolol tartrate (LOPRESSOR) 25 MG tablet Take 1 tablet (25 mg total)  by mouth 2 (two) times daily. (Patient taking differently: Take 25 mg by mouth 2 (two) times daily. 2 tablets in  AM & 1 tablet in PM) 180 tablet 3   Multiple Vitamins-Minerals (MULTIVITAMIN PO) Take 1 tablet by mouth daily.     L-ARGININE PO Take by mouth daily. (Patient not taking: Reported on 02/09/2021)     lisinopril (ZESTRIL) 10 MG tablet Take 10 mg by mouth daily. (Patient not taking: Reported on 02/09/2021)     No current facility-administered medications on file prior to visit.    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  All other systems are negative.  Physical Exam: Blood pressure 140/80, pulse 64, height '5\' 9"'$  (1.753 m), weight 162 lb (73.5 kg), SpO2 95 %.  GEN:  Well nourished, well developed in no acute distress HEENT: Normal NECK: No JVD; No carotid bruits LYMPHATICS: No lymphadenopathy CARDIAC: RRR , mid systolic click, late peaking systolic murmur RESPIRATORY:  Clear to auscultation without rales, wheezing or rhonchi  ABDOMEN: Soft, non-tender, non-distended MUSCULOSKELETAL:  No edema; No deformity  SKIN: Warm and dry NEUROLOGIC:  Alert and oriented x 3  Well developed, well nourished, in no acute distress.    ECG:  setp. 12, 2022:   NSR - occas PACs. no ST or T wav e changes.     Assessment / Plan:    1. Palpitations -   likely combination of premature atrial contractions and premature ventricular contractions.  Her palpitations have improved since increasing her dose of metoprolol.  I encouraged her to ask her primary medical doctor about getting a TSH level on her.  2. HTN-   increase losartan to 50 mg a day  She will continue to measure her blood pressure.  We may have to increase the losartan up to 100 mg a day.  We will check a basic metabolic profile approximately 2 to 3 weeks following her final dose increase.   3. Mitral valve prolapse:  classic exam for mvp .  Has a late systolic murmur , mid systolic click .     [name], MD  02/09/2021 2:04 PM    [place],  [place], [place]  [address] Pager [phone] Phone: [phone]; Fax: [phone]   [facility]  [address] [place] [address] [place], [place]  [address] [phone]   Fax [phone]

## 2021-02-09 ENCOUNTER — Encounter: Payer: Self-pay | Admitting: Cardiovascular Disease

## 2021-02-09 ENCOUNTER — Ambulatory Visit: Payer: BC Managed Care – PPO | Admitting: Cardiovascular Disease

## 2021-02-09 ENCOUNTER — Other Ambulatory Visit: Payer: Self-pay

## 2021-02-09 VITALS — BP 140/80 | HR 64 | Ht 69.0 in | Wt 162.0 lb

## 2021-02-09 DIAGNOSIS — I10 Essential (primary) hypertension: Secondary | ICD-10-CM | POA: Diagnosis not present

## 2021-02-09 DIAGNOSIS — I341 Nonrheumatic mitral (valve) prolapse: Secondary | ICD-10-CM | POA: Diagnosis not present

## 2021-02-09 MED ORDER — LOSARTAN POTASSIUM 50 MG PO TABS: 50.0000 mg | ORAL_TABLET | Freq: Every day | ORAL | 3 refills | Status: DC

## 2021-02-09 NOTE — Patient Instructions (Signed)
Medication Instructions:  Your physician has recommended you make the following change in your medication:   STOP Lisinopril STARTLosartan 50 mg once daily *If you need a refill on your cardiac medications before your next appointment, please call your pharmacy*   Lab Work: Your physician recommends that you return for lab work on Monday Oct. 3  If you have labs (blood work) drawn today and your tests are completely normal, you will receive your results only by: [employer] (if you have MyChart) OR A paper copy in the mail If you have any lab test that is abnormal or we need to change your treatment, we will call you to review the results.   Testing/Procedures: None Ordered   Follow-Up: At [facility], you and your health needs are our priority.  As part of our continuing mission to provide you with exceptional heart care, we have created designated Provider Care Teams.  These Care Teams include your primary Cardiologist (physician) and Advanced Practice Providers (APPs -  Physician Assistants and Nurse Practitioners) who all work together to provide you with the care you need, when you need it.  We recommend signing up for the patient portal called "MyChart".  Sign up information is provided on this After Visit Summary.  MyChart is used to connect with patients for Virtual Visits (Telemedicine).  Patients are able to view lab/test results, encounter notes, upcoming appointments, etc.  Non-urgent messages can be sent to your provider as well.   To learn more about what you can do with MyChart, go to [email or web address].    Your next appointment:   3 month(s)  The format for your next appointment:   In Person  Provider:   You may see [name], MD or one of the following Advanced Practice Providers on your designated Care Team:   Richardson Dopp, PA-C [place], [place]

## 2021-03-02 ENCOUNTER — Other Ambulatory Visit: Payer: BC Managed Care – PPO

## 2021-03-09 ENCOUNTER — Other Ambulatory Visit: Payer: BC Managed Care – PPO | Admitting: *Deleted

## 2021-03-09 ENCOUNTER — Other Ambulatory Visit: Payer: Self-pay

## 2021-03-09 DIAGNOSIS — I10 Essential (primary) hypertension: Secondary | ICD-10-CM

## 2021-03-09 LAB — BASIC METABOLIC PANEL
BUN/Creatinine Ratio: 17 (ref 12–28)
BUN: 12 mg/dL (ref 8–27)
CO2: 25 mmol/L (ref 20–29)
Calcium: 9.9 mg/dL (ref 8.7–10.3)
Chloride: 102 mmol/L (ref 96–106)
Creatinine, Ser: 0.7 mg/dL (ref 0.57–1.00)
Glucose: 97 mg/dL (ref 70–99)
Potassium: 4.8 mmol/L (ref 3.5–5.2)
Sodium: 141 mmol/L (ref 134–144)
eGFR: 92 mL/min/{1.73_m2} (ref >59)

## 2021-03-24 ENCOUNTER — Ambulatory Visit: Payer: BC Managed Care – PPO | Admitting: Cardiovascular Disease

## 2021-05-13 ENCOUNTER — Ambulatory Visit: Payer: BC Managed Care – PPO | Admitting: Cardiovascular Disease

## 2021-05-13 ENCOUNTER — Other Ambulatory Visit: Payer: Self-pay

## 2021-05-13 ENCOUNTER — Encounter: Payer: Self-pay | Admitting: Cardiovascular Disease

## 2021-05-13 VITALS — BP 186/110 | HR 71 | Ht 70.0 in | Wt 165.4 lb

## 2021-05-13 DIAGNOSIS — I10 Essential (primary) hypertension: Secondary | ICD-10-CM

## 2021-05-13 DIAGNOSIS — Z79899 Other long term (current) drug therapy: Secondary | ICD-10-CM | POA: Diagnosis not present

## 2021-05-13 MED ORDER — HYDROCHLOROTHIAZIDE 25 MG PO TABS: 25.0000 mg | ORAL_TABLET | Freq: Every day | ORAL | 1 refills | Status: DC

## 2021-05-13 MED ORDER — LOSARTAN POTASSIUM 100 MG PO TABS: 100.0000 mg | ORAL_TABLET | Freq: Every day | ORAL | 1 refills | Status: DC

## 2021-05-13 NOTE — Progress Notes (Signed)
[name] Date of Birth  10-Mar-1946       [place], [place]  [address]     [phone]        Problem List: 1. Palpitations - likely PVCs 2. HTN 3. Mitral valve prolapse   Previous Notes:  [name] is a 75 yo who is relatively healthy.  She has noticed her BP has been higher recently.    She eats a very healthy diet.   She eat a vegetarian diet.  Hikes regularly.  Avoids salt.  He has gone through menopause.    Her lab work has been ok in the past  Aug. 30, 2016:  [name] is seen again today for her palpitations . She thinks that her HR beats very fast - these can last about an hour .  These are not associated with CP , dyspnea, dizziness.  Typically in the  afternoon / early evening  Occurs frequently if she is fatigued  Also has noticed that her BP has been increasing  BP has been 140s   Walks and does yoga regularly   Sept. 12, 2022 [name] is seen today after a 6 year absence. Her BP has been gradually increaseing  Her palpitations have also increased  She has increased her metoprolol to 50 Q AM, 25 mg Q OM Has had some increased sweating . No hot or cold isses Trys to eat well. Does yoga  Not cardio per e Hikes at [facility] on occasion .  Her home BP readings -130-160s / 80-90s  No CP , no dyspnea, no syncope   She is sleeping well - now that her palpitations are better    Dec. 14, 2022: [name] is seen for follow up of her HTN and MVP  Her home BP readings have been elevated. 140s  Has had some vein treatments - is not having any pain  Palpitations are better with the metoprolol 50 Q AM , 25 mg Q PM     Current Outpatient Medications on File Prior to Visit  Medication Sig Dispense Refill   B Complex-Biotin-FA (B COMPLETE PO) Take 1 tablet by mouth daily.      Calcium-Magnesium-Vitamin D (CALCIUM MAGNESIUM PO) Take 1 tablet by mouth daily.      cholecalciferol (VITAMIN D) 1000 UNITS tablet Take 2,000 Units by  mouth daily.     ibuprofen (ADVIL) 600 MG tablet Take 600 mg by mouth 3 (three) times daily as needed.     losartan (COZAAR) 50 MG tablet Take 1 tablet (50 mg total) by mouth daily. 90 tablet 3   metoprolol tartrate (LOPRESSOR) 25 MG tablet Take 1 tablet (25 mg total) by mouth 2 (two) times daily. (Patient taking differently: Take 25 mg by mouth 2 (two) times daily. 2 tablets in  AM & 1 tablet in PM) 180 tablet 3   Multiple Vitamins-Minerals (MULTIVITAMIN PO) Take 1 tablet by mouth daily.     L-ARGININE PO Take by mouth daily. (Patient not taking: Reported on 02/09/2021)     No current facility-administered medications on file prior to visit.    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  All other systems are negative.   Physical Exam: Blood pressure (!) 186/110, pulse 71, height 5\' 10"  (1.778 m), weight 165 lb 6.4 oz (75 kg), SpO2 99 %.  GEN:  Well nourished, well developed in no acute distress HEENT: Normal NECK: No JVD; No carotid bruits LYMPHATICS: No lymphadenopathy CARDIAC: RRR , no murmurs, rubs, gallops RESPIRATORY:  Clear to auscultation without rales, wheezing or rhonchi  ABDOMEN: Soft, non-tender, non-distended MUSCULOSKELETAL:  No edema; No deformity  SKIN: Warm and dry NEUROLOGIC:  Alert and oriented x  3 .    ECG:    Assessment / Plan:   1. Palpitations -   likely combination of premature atrial contractions and premature ventricular contractions.  Symptoms are much better on metoprolol.  2. HTN-   Blood pressures elevated today.  We will increase the losartan to 100 mg a day.  We will add HCTZ 25 mg a day.  We will check a basic metabolic profile in 2 weeks.  She will see the hypertension clinic in 3 to 4 weeks.  I will plan on seeing her again in 3 to 4 months.    3. Mitral valve prolapse:  classic exam for mvp .  Has a late systolic murmur , mid systolic click .     [name], MD  05/13/2021 4:25 PM    [facility] [place],  [place], [place]  [address] Pager [phone] Phone: [phone]; Fax: [phone]

## 2021-05-13 NOTE — Patient Instructions (Signed)
Medication Instructions:   INCREASE YOUR LOSARTAN TO 100 MG BY MOUTH DAILY  START TAKING HYDROCHLOROTHIAZIDE 25 MG BY MOUTH DAILY  *If you need a refill on your cardiac medications before your next appointment, please call your pharmacy*   Lab Work:  IN 2 [facility] OFFICE--BMET  If you have labs (blood work) drawn today and your tests are completely normal, you will receive your results only by: [employer] (if you have MyChart) OR A paper copy in the mail If you have any lab test that is abnormal or we need to change your treatment, we will call you to review the results.   You have been referred to OUR BP CLINIC TO SEE OUR PHARMACIST IN 3-4 WEEKS   Follow-Up:  3-4 WEEKS WITH PHARMACIST IN OUR BLOOD PRESSURE CLINIC  3 MONTHS IN THE OFFICE WITH DR. Acie Fredrickson

## 2021-05-27 ENCOUNTER — Other Ambulatory Visit: Payer: Self-pay

## 2021-05-27 ENCOUNTER — Other Ambulatory Visit: Payer: BC Managed Care – PPO | Admitting: *Deleted

## 2021-05-27 DIAGNOSIS — Z79899 Other long term (current) drug therapy: Secondary | ICD-10-CM

## 2021-05-27 DIAGNOSIS — I10 Essential (primary) hypertension: Secondary | ICD-10-CM

## 2021-05-27 LAB — BASIC METABOLIC PANEL
BUN/Creatinine Ratio: 16 (ref 12–28)
BUN: 12 mg/dL (ref 8–27)
CO2: 26 mmol/L (ref 20–29)
Calcium: 9.9 mg/dL (ref 8.7–10.3)
Chloride: 103 mmol/L (ref 96–106)
Creatinine, Ser: 0.73 mg/dL (ref 0.57–1.00)
Glucose: 79 mg/dL (ref 70–99)
Potassium: 4.8 mmol/L (ref 3.5–5.2)
Sodium: 142 mmol/L (ref 134–144)
eGFR: 87 mL/min/{1.73_m2} (ref >59)

## 2021-06-05 ENCOUNTER — Ambulatory Visit: Payer: BC Managed Care – PPO

## 2021-06-16 NOTE — Progress Notes (Signed)
Patient ID: [name]                 DOB: May 02, 1946                      MRN: [id number]     HPI: [name] is a 76 y.o. female referred by Dr. Acie Fredrickson to HTN clinic. PMH is significant for palpitations (likely PVC), HTN, mitral valve prolapse. Pt was seen in 2016 for heart palpitations, that lasted over an hour and often occurred in afternoon/early evening. Pt also noted then that her home SBP had been in the 140s. After a long absence, pt was seen in 01/2021 and reported more palpitations and a steadily increasing BP (home BP readings ranged from 130-160s / 80-90s). Her metoprolol was increased to 50 mg in the morning and 25 mg in the evening. At last visit, home BP readings remained elevated in SBP of 140s and clinic BP highly elevated at 186/110, HR 71. Increased losartan to 100 mg daily and added HCTZ 25 mg daily. Recommended f/u in 2 weeks for BMET and HTN visit in 3-4 weeks. BMET stable.   Today, pt reports that she has been feeling better on the higher dose of losartan and has more energy. She has noticed with the higher dose that a few of her left fingers have been jumpy and her skin has gotten red on her hands and feet. She reports not starting hydrochlorothiazide as she read it contains sulfa and she she has a sulfa allergy (hives and throat swelling). Reports metoprolol has been helping with her palpitations. She is taking Tylenol OTC as needed and CoQ10, no NSAID use. Pt has taken her meds this morning and drank a green tea. Denies dizziness, HA, blurry vision, swelling. Takes her home blood pressure daily about an hour after taking her morning medications. Pt mentions she often has a much higher BP at the doctor due to nerves. Home blood pressure monitor was brought in at this visit to compare to our clinic measurements. Both reported elevated BP in high 150s-160s/90. Home blood pressures have been much lower, averaging 110-120s/80, HR 50s (see below for detailed log). She has a bicep  Omron cuff that she's had for a few years, readings measure well compared to clinic reading.  Current HTN meds: losartan 100 mg daily, metoprolol tartrate 50 mg in the morning & 25 mg in the evening  BP goal: <130/54mmHg  Family History: Mother - stroke, Father - heart attack, sister - breast cancer  Social History: No tobacco use, rare alcohol use  Diet: She eats a very healthy diet, vegetarian diet, more recently vegan. Avoids salt.   Exercise: Engages in yoga 5x week, walks 5000-8000 steps per day, enjoys hiking  Home BP readings:   Home BP cuff (in-clinic): 173/107, improved to 160/87 on recheck  Clinic BP reading: 158/92  Home readings: averaging 130/80s, low: 109/79 - 107/77, HR 52 - 116/86, HR 56 - 122/79, Hr 48 - 138/87, HR 51 - 120/78, HR 52 - 108/79, HR 53 - 130/91 - 117/80 - 136/84, HR 55 - 110/81, HR 56  Wt Readings from Last 3 Encounters:  05/13/21 165 lb 6.4 oz (75 kg)  02/09/21 162 lb (73.5 kg)  01/14/16 158 lb 12.8 oz (72 kg)   BP Readings from Last 3 Encounters:  05/13/21 (!) 186/110  02/09/21 140/80  01/14/16 130/72   Pulse Readings from Last 3 Encounters:  05/13/21 71  02/09/21 64  01/14/16 68    Renal function: CrCl cannot be calculated (Unknown ideal weight.).  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      Calcium-Magnesium-Vitamin D (CALCIUM MAGNESIUM PO) Take 1 tablet by mouth daily.      cholecalciferol (VITAMIN D) 1000 UNITS tablet Take 2,000 Units by mouth daily.     hydrochlorothiazide (HYDRODIURIL) 25 MG tablet Take 1 tablet (25 mg total) by mouth daily. 90 tablet 1   ibuprofen (ADVIL) 600 MG tablet Take 600 mg by mouth 3 (three) times daily as needed.     L-ARGININE PO Take by mouth daily. (Patient not taking: Reported on  02/09/2021)     losartan (COZAAR) 100 MG tablet Take 1 tablet (100 mg total) by mouth daily. 90 tablet 1   metoprolol tartrate (LOPRESSOR) 25 MG tablet Take 1 tablet (25 mg total) by mouth 2 (two) times daily. (Patient taking differently: Take 25 mg by mouth 2 (two) times daily. 2 tablets in  AM & 1 tablet in PM) 180 tablet 3   Multiple Vitamins-Minerals (MULTIVITAMIN PO) Take 1 tablet by mouth daily.     No current facility-administered medications on file prior to visit.    Allergies  Allergen Reactions   Sulfamethoxazole-Trimethoprim     Other reaction(s): hives, itching   Sulfonamide Derivatives     REACTION: neck/throat swelling     Assessment/Plan:  1. Hypertension -  Pt with white coat HTN and elevated clinic reading compared to home BP that are consistently at goal <130/63mmHg. Home cuff is measuring accurately so will adjust BP medications according to home readings. Plan to continue losartan 100 mg daily and metoprolol tartrate 50 mg in the morning & 25 mg in the evening. Due to sulfa allergy, thiazide diuretics will not be utilized and HCTZ has been removed from her medication list (she never started this). Pt will continue to monitor home BP readings. Encouraged patient to continue a heart healthy diet and exercising. F/u with pt as needed.   Pt seen with Jethro Poling, P4 pharmacy student  [place]. [place], PharmD, BCACP, [place] [address] [place], [place] [address] Phone: [phone]; Fax: [phone] 06/17/2021 10:22 AM

## 2021-06-17 ENCOUNTER — Ambulatory Visit: Payer: BC Managed Care – PPO | Admitting: Pharmacist

## 2021-06-17 ENCOUNTER — Other Ambulatory Visit: Payer: Self-pay

## 2021-06-17 VITALS — BP 158/92 | HR 60

## 2021-06-17 DIAGNOSIS — I10 Essential (primary) hypertension: Secondary | ICD-10-CM

## 2021-06-17 MED ORDER — METOPROLOL TARTRATE 25 MG PO TABS: ORAL_TABLET | ORAL | 3 refills | Status: DC

## 2021-06-17 NOTE — Patient Instructions (Addendum)
It was nice to meet you today!  Your blood pressure goal is <130/80 mmHg.   Your home blood pressures have been at goal.  Continue losartan 100 mg by mouth daily and metoprolol tartrate 50 mg by mouth in the morning & 25 mg by mouth in the evening.  Continue to take your blood pressure at home.  Continue to eat a heart healthy diet and exercise.

## 2021-06-17 NOTE — Progress Notes (Deleted)
° °  Subjective:    Patient ID: [name], female    DOB: November 12, 1945, 76 y.o.   MRN: [id number]  HPI    Review of Systems     Objective:   Physical Exam        Assessment & Plan:

## 2021-08-31 ENCOUNTER — Ambulatory Visit: Payer: BC Managed Care – PPO | Admitting: Cardiovascular Disease

## 2021-09-28 ENCOUNTER — Ambulatory Visit: Payer: BC Managed Care – PPO | Admitting: Cardiovascular Disease

## 2021-09-28 ENCOUNTER — Encounter: Payer: Self-pay | Admitting: Cardiovascular Disease

## 2021-09-28 VITALS — BP 142/78 | HR 61 | Ht 69.5 in | Wt 159.6 lb

## 2021-09-28 DIAGNOSIS — I10 Essential (primary) hypertension: Secondary | ICD-10-CM

## 2021-09-28 DIAGNOSIS — I341 Nonrheumatic mitral (valve) prolapse: Secondary | ICD-10-CM | POA: Diagnosis not present

## 2021-09-28 NOTE — Patient Instructions (Addendum)
Medication Instructions:  ?Your physician recommends that you continue on your current medications as directed. Please refer to the Current Medication list given to you today. ? ?*If you need a refill on your cardiac medications before your next appointment, please call your pharmacy* ? ? ?Lab Work: ?None ?If you have labs (blood work) drawn today and your tests are completely normal, you will receive your results only by: ?MyChart Message (if you have MyChart) OR ?A paper copy in the mail ?If you have any lab test that is abnormal or we need to change your treatment, we will call you to review the results. ? ? ?Follow-Up: ?At [facility], you and your health needs are our priority.  As part of our continuing mission to provide you with exceptional heart care, we have created designated Provider Care Teams.  These Care Teams include your primary Cardiologist (physician) and Advanced Practice Providers (APPs -  Physician Assistants and Nurse Practitioners) who all work together to provide you with the care you need, when you need it. ? ? ?Your next appointment:   ?1 year(s) ? ?The format for your next appointment:   ?In Person ? ?Provider:   ?[name], MD { ? ?

## 2021-09-28 NOTE — Progress Notes (Signed)
? ? ? ?[name] ?Date of Birth  1946/04/24 ?      ?[place]      ?[place], [place]  [address]     ?[phone] ?[phone] ?      ? ?Problem List: ?1. Palpitations - likely PVCs ?2. HTN ?3. Mitral valve prolapse  ? ?Previous Notes: ? ?[name] is a 76 yo who is relatively healthy.  She has noticed her BP has been higher recently.    She eats a very healthy diet.   She eat a vegetarian diet.  Hikes regularly.  Avoids salt.  He has gone through menopause.    Her lab work has been ok in the past ? ?Aug. 30, 2016: ? ?[name] is seen again today for her palpitations . ?She thinks that her HR beats very fast - these can last about an hour .  ?These are not associated with CP , dyspnea, dizziness.  ?Typically in the  afternoon / early evening  ?Occurs frequently if she is fatigued ? ?Also has noticed that her BP has been increasing  ?BP has been 140s  ? ?Walks and does yoga regularly  ? ?Sept. 12, 2022 ?[name] is seen today after a 6 year absence. ?Her BP has been gradually increaseing  ?Her palpitations have also increased  ?She has increased her metoprolol to 50 Q AM, 25 mg Q OM ?Has had some increased sweating . ?No hot or cold isses ?Trys to eat well. ?Does yoga  ?Not cardio per e ?Hikes at [facility] on occasion .  ?Her home BP readings -130-160s / 80-90s ? ?No CP , no dyspnea, no syncope  ? ?She is sleeping well - now that her palpitations are better  ? ? ?Dec. 14, 2022: ?[name] is seen for follow up of her HTN and MVP  ?Her home BP readings have been elevated. 140s  ?Has had some vein treatments - is not having any pain  ?Palpitations are better with the metoprolol 50 Q AM , 25 mg Q PM ? ?Sep 28, 2021  ?[name] is seen today for follow-up of her hypertension and mitral valve prolapse. ? ?BP has been well controlled at home  ?No CP , no dyspnea ?Walks every day  ?Hikes regularly  ? ?  ? ? ?Current Outpatient Medications on File Prior to Visit  ?Medication Sig Dispense Refill  ? B Complex-Biotin-FA (B COMPLETE  PO) Take 1 tablet by mouth daily.     ? cholecalciferol (VITAMIN D) 1000 UNITS tablet Take 2,000 Units by mouth daily.    ? co-enzyme Q-10 30 MG capsule Take 30 mg by mouth daily.    ? losartan (COZAAR) 100 MG tablet Take 1 tablet (100 mg total) by mouth daily. 90 tablet 1  ? MAGNESIUM CITRATE PO Take 1,200 mg by mouth daily.    ? metoprolol tartrate (LOPRESSOR) 25 MG tablet Take 2 tablets by mouth in the morning & 1 tablet by mouth in the evening 270 tablet 3  ? Multiple Vitamins-Minerals (MULTIVITAMIN PO) Take 1 tablet by mouth daily.    ? ?No current facility-administered medications on file prior to visit.  ? ? ?Allergies  ?Allergen Reactions  ? Sulfamethoxazole-Trimethoprim   ?  Other reaction(s): hives, itching  ? Sulfonamide Derivatives   ?  REACTION: neck/throat swelling  ? ? ?Past Medical History:  ?Diagnosis Date  ? 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 ? ?Past Surgical History:  ?Procedure Laterality Date  ? ABDOMINAL HYSTERECTOMY  08/2007  ?  Exp lap, TAH/BSO, staging  ? ? ?Social History  ? ?Tobacco Use  ?Smoking Status Never  ?Smokeless Tobacco Never  ? ? ?Social History  ? ?Substance and Sexual Activity  ?Alcohol Use Yes  ? Comment: Rare  ? ? ?Family History  ?Problem Relation Age of Onset  ? Stroke Mother   ? Heart attack Father   ? Breast cancer Sister   ? ? ?Reviw of Systems:  ?Reviewed in the HPI.  All other systems are negative. ? ? ?Physical Exam: ?Blood pressure (!) 142/78, pulse 61, height 5' 9.5" (1.765 m), weight 159 lb 9.6 oz (72.4 kg), SpO2 98 %. ? ?GEN:  Well nourished, well developed in no acute distress ?HEENT: Normal ?NECK: No JVD; No carotid bruits ?LYMPHATICS: No lymphadenopathy ?CARDIAC: RRR , mid systolic click  ?RESPIRATORY:  Clear to auscultation without rales, wheezing or rhonchi  ?ABDOMEN: Soft, non-tender, non-distended ?MUSCULOSKELETAL:  No edema; No deformity  ?SKIN: Warm and dry ?NEUROLOGIC:  Alert and oriented x 3 ? ? ? ?ECG:   ? ?Assessment / Plan:  ? ?1.  Palpitations -   better  ? ?2. HTN-  ?BP is typically well controlled at home  ?Cont meds ? ? ? ?3. Mitral valve prolapse:  classic exam for mvp .   Very soft late systolic murmur  ? ? ? ?[name], MD  ?09/28/2021 9:48 AM    ?[facility] [address] [address],  [address] ?[place], [place]  [address] ?Pager [phone] ?Phone: [phone]; Fax: [phone]  ? ? ? ?

## 2021-11-17 ENCOUNTER — Other Ambulatory Visit: Payer: Self-pay | Admitting: Cardiovascular Disease

## 2021-11-17 DIAGNOSIS — I10 Essential (primary) hypertension: Secondary | ICD-10-CM

## 2021-11-17 DIAGNOSIS — Z79899 Other long term (current) drug therapy: Secondary | ICD-10-CM

## 2022-06-15 ENCOUNTER — Other Ambulatory Visit: Payer: Self-pay

## 2022-06-15 MED ORDER — METOPROLOL TARTRATE 25 MG PO TABS: ORAL_TABLET | ORAL | 1 refills | Status: DC

## 2022-10-26 NOTE — Progress Notes (Unsigned)
  Cardiology Office Note:    Date:  10/27/2022  ID:  AZARIYA WALTMAN, DOB 26-Apr-1946, MRN [id number] PCP: Jonnie Kind, MD  [facility] Providers Cardiologist:  Kristeen Miss, MD       Patient Profile:      Palpitations Hypertension  MVP TTE  02/06/15: mild LVH, EF 55-60, no RWMA, mild LAE, trivial MR Hx of ovarian CA       History of Present Illness:   [name] is a 77 y.o. female who returns for follow-up of hypertension and mitral valve prolapse.  She was last seen by Dr. Elease Hashimoto 09/28/2021.  She is here alone.  She is doing well.  She continues to walk daily and does yoga 5 times a week.  She has not had chest pain, shortness of breath, syncope, palpitations, dizziness.  She notes blood pressure elevations in our office typically.  Recent blood pressure with primary care was normal.  Blood pressures at home have been 130s/80s.  ROS See HPI    Studies Reviewed:    EKG: Sinus bradycardia, HR 56, normal axis, no ST-T wave changes, QTc 413, no change since last tracing  Risk Assessment/Calculations:     HYPERTENSION CONTROL Vitals:   10/27/22 0906 10/27/22 1655  BP: (!) 190/89 (!) 176/104    The patient's blood pressure is elevated above target today.  In order to address the patient's elevated BP: Blood pressure will be monitored at home to determine if medication changes need to be made.; The blood pressure is usually elevated in clinic.  Blood pressures monitored at home have been optimal.          Physical Exam:   VS:  BP (!) 176/104   Pulse (!) 56   Ht 5' 9.5" (1.765 m)   Wt 161 lb 3.2 oz (73.1 kg)   SpO2 97%   BMI 23.46 kg/m    Wt Readings from Last 3 Encounters:  10/27/22 161 lb 3.2 oz (73.1 kg)  09/28/21 159 lb 9.6 oz (72.4 kg)  05/13/21 165 lb 6.4 oz (75 kg)    Constitutional:      Appearance: Healthy appearance. Not in distress.  Neck:     Vascular: JVD normal.  Pulmonary:     Breath sounds: Normal breath sounds. No wheezing. No rales.   Cardiovascular:     Normal rate. Regular rhythm.     Murmurs: There is no murmur.     No click.  Edema:    Ankle: trace edema of the right ankle. Abdominal:     Palpations: Abdomen is soft.       ASSESSMENT AND PLAN:   Essential hypertension She notes significant increases in blood pressure when she comes to our office.  Her blood pressure primary care was normal.  Her blood pressures at home have been optimal.  I have asked her to monitor blood pressure for 2 weeks and send those readings for review.  Continue losartan 100 mg daily, metoprolol tartrate 50 mg in the morning and 25 mg in the evening.  Palpitations Symptoms are well-controlled on current dose of beta-blocker.  Continue metoprolol tartrate 50 mg the morning, 25 mg in the evening.  Mitral valve prolapse No significant evidence of mitral valve prolapse or significant mitral regurgitation on last echo in 2016.     Dispo:  Return in about 1 year (around 10/27/2023) for Routine Follow Up, w/ Dr. Elease Hashimoto, or Tereso Newcomer, PA-C.  Signed, Tereso Newcomer, PA-C

## 2022-10-27 ENCOUNTER — Encounter: Payer: Self-pay | Admitting: Physician Assistant

## 2022-10-27 ENCOUNTER — Ambulatory Visit: Payer: BC Managed Care – PPO | Attending: Physician Assistant | Admitting: Physician Assistant

## 2022-10-27 VITALS — BP 176/104 | HR 56 | Ht 69.5 in | Wt 161.2 lb

## 2022-10-27 DIAGNOSIS — R002 Palpitations: Secondary | ICD-10-CM | POA: Diagnosis not present

## 2022-10-27 DIAGNOSIS — I10 Essential (primary) hypertension: Secondary | ICD-10-CM | POA: Diagnosis not present

## 2022-10-27 DIAGNOSIS — Z79899 Other long term (current) drug therapy: Secondary | ICD-10-CM | POA: Diagnosis not present

## 2022-10-27 DIAGNOSIS — I341 Nonrheumatic mitral (valve) prolapse: Secondary | ICD-10-CM

## 2022-10-27 MED ORDER — LOSARTAN POTASSIUM 100 MG PO TABS: ORAL_TABLET | ORAL | 3 refills | Status: DC

## 2022-10-27 NOTE — Assessment & Plan Note (Signed)
She notes significant increases in blood pressure when she comes to our office.  Her blood pressure primary care was normal.  Her blood pressures at home have been optimal.  I have asked her to monitor blood pressure for 2 weeks and send those readings for review.  Continue losartan 100 mg daily, metoprolol tartrate 50 mg in the morning and 25 mg in the evening.

## 2022-10-27 NOTE — Assessment & Plan Note (Signed)
No significant evidence of mitral valve prolapse or significant mitral regurgitation on last echo in 2016.

## 2022-10-27 NOTE — Patient Instructions (Signed)
Medication Instructions:  Your physician recommends that you continue on your current medications as directed. Please refer to the Current Medication list given to you today.  *If you need a refill on your cardiac medications before your next appointment, please call your pharmacy*   Lab Work: None ordered  If you have labs (blood work) drawn today and your tests are completely normal, you will receive your results only by: MyChart Message (if you have MyChart) OR A paper copy in the mail If you have any lab test that is abnormal or we need to change your treatment, we will call you to review the results.   Testing/Procedures: None ordered   Follow-Up: At [facility], you and your health needs are our priority.  As part of our continuing mission to provide you with exceptional heart care, we have created designated Provider Care Teams.  These Care Teams include your primary Cardiologist (physician) and Advanced Practice Providers (APPs -  Physician Assistants and Nurse Practitioners) who all work together to provide you with the care you need, when you need it.  We recommend signing up for the patient portal called "MyChart".  Sign up information is provided on this After Visit Summary.  MyChart is used to connect with patients for Virtual Visits (Telemedicine).  Patients are able to view lab/test results, encounter notes, upcoming appointments, etc.  Non-urgent messages can be sent to your provider as well.   To learn more about what you can do with MyChart, go to [email or web address].    Your next appointment:   12 month(s)  Provider:   Kristeen Miss, MD     Other Instructions Your physician has requested that you regularly monitor and record your blood pressure readings at home. Please use the same machine at the same time of day to check your readings and record them to bring to your follow-up visit.   Please monitor blood pressures and keep a log of your readings  for 2 weeks then send a mychart message with them.    Make sure to check 2 hours after your medications.    AVOID these things for 30 minutes before checking your blood pressure: No Drinking caffeine. No Drinking alcohol. No Eating. No Smoking. No Exercising.   Five minutes before checking your blood pressure: Pee. Sit in a dining chair. Avoid sitting in a soft couch or armchair. Be quiet. Do not talk

## 2022-10-27 NOTE — Assessment & Plan Note (Signed)
Symptoms are well-controlled on current dose of beta-blocker.  Continue metoprolol tartrate 50 mg the morning, 25 mg in the evening.

## 2022-11-07 ENCOUNTER — Other Ambulatory Visit: Payer: Self-pay | Admitting: Cardiovascular Disease

## 2022-11-07 DIAGNOSIS — Z79899 Other long term (current) drug therapy: Secondary | ICD-10-CM

## 2022-11-07 DIAGNOSIS — I10 Essential (primary) hypertension: Secondary | ICD-10-CM

## 2022-12-16 ENCOUNTER — Other Ambulatory Visit: Payer: Self-pay | Admitting: Cardiovascular Disease

## 2023-06-21 ENCOUNTER — Ambulatory Visit: Payer: Medicare PPO | Attending: Physician Assistant

## 2023-06-21 ENCOUNTER — Telehealth: Payer: Self-pay

## 2023-06-21 DIAGNOSIS — R002 Palpitations: Secondary | ICD-10-CM

## 2023-06-21 NOTE — Telephone Encounter (Signed)
Increase Metoprolol tartrate to 50 mg twice daily  If pt has vomiting and diarrhea - push fluids, electrolytes and follow up with PCP. Arrange 2 week Zio XT monitor (palpitations).  Tereso Newcomer, PA-C    06/21/2023 4:37 PM

## 2023-06-21 NOTE — Progress Notes (Unsigned)
Enrolled patient for a 14 day Zio XT monitor to be mailed to patients home  Nahser to read

## 2023-06-21 NOTE — Telephone Encounter (Signed)
[name] advising Increase Metoprolol tartrate to 50 mg twice daily due to palpitations.  If pt has vomiting and diarrhea - push fluids, electrolytes and follow up with PCP. 2 week Zio XT monitor ordered for palpitations. Patient advised of all via MyChart.

## 2023-06-24 MED ORDER — METOPROLOL TARTRATE 25 MG PO TABS: 50.0000 mg | ORAL_TABLET | Freq: Two times a day (BID) | ORAL | Status: DC

## 2023-06-24 MED ORDER — METOPROLOL TARTRATE 50 MG PO TABS: 50.0000 mg | ORAL_TABLET | Freq: Two times a day (BID) | ORAL | 3 refills | Status: DC

## 2023-06-24 NOTE — Addendum Note (Signed)
Addended by: Burnetta Sabin on: 06/24/2023 10:17 AM   Modules accepted: Orders

## 2023-07-02 DIAGNOSIS — R002 Palpitations: Secondary | ICD-10-CM

## 2023-07-22 ENCOUNTER — Telehealth: Payer: Self-pay

## 2023-07-22 NOTE — Telephone Encounter (Signed)
-----   Message from [place] sent at 07/22/2023  4:54 PM EST ----- Monitor shows atrial flutter with avg HR 122. This is new for her. CHADS2-VASc=4. She will need anticoagulation.  PLAN: -Start Eliquis 5 mg twice daily -Increase Metoprolol tartrate to 75 mg twice daily  -If BP <100 systolic, decreased Losartan to 50 mg once daily. -If pt is near syncope, shortness of breath, having chest pain or significantly fatigued, go to the ED -Please arrange follow up next week. Tereso Newcomer, PA-C    07/22/2023 4:54 PM

## 2023-07-22 NOTE — Telephone Encounter (Signed)
LVMTCB 2/21

## 2023-07-23 ENCOUNTER — Other Ambulatory Visit: Payer: Self-pay | Admitting: Physician Assistant

## 2023-07-23 MED ORDER — APIXABAN 5 MG PO TABS: 5.0000 mg | ORAL_TABLET | Freq: Two times a day (BID) | ORAL | 3 refills | Status: DC

## 2023-07-23 MED ORDER — METOPROLOL TARTRATE 50 MG PO TABS: 75.0000 mg | ORAL_TABLET | Freq: Two times a day (BID) | ORAL | 3 refills | Status: DC

## 2023-07-23 NOTE — Telephone Encounter (Signed)
 Spoke with patient. Appt scheduled for 3/5 with me. Tereso Newcomer, PA-C    07/23/2023 12:03 PM

## 2023-08-02 MED ORDER — METOPROLOL TARTRATE 100 MG PO TABS: 100.0000 mg | ORAL_TABLET | Freq: Two times a day (BID) | ORAL | 3 refills | Status: DC

## 2023-08-02 NOTE — Addendum Note (Signed)
 Addended byAlben Spittle, Lorin Picket T on: 08/02/2023 12:10 PM   Modules accepted: Orders

## 2023-08-02 NOTE — Telephone Encounter (Signed)
 Increase Metoprolol tartrate to 100 mg twice daily I sent new Rx to pharmacy. Tereso Newcomer, PA-C    08/02/2023 12:09 PM

## 2023-08-03 ENCOUNTER — Ambulatory Visit: Payer: Medicare PPO | Attending: Physician Assistant | Admitting: Physician Assistant

## 2023-08-03 ENCOUNTER — Encounter: Payer: Self-pay | Admitting: Physician Assistant

## 2023-08-03 VITALS — BP 160/120 | HR 136 | Ht 69.0 in | Wt 160.4 lb

## 2023-08-03 DIAGNOSIS — I4892 Unspecified atrial flutter: Secondary | ICD-10-CM | POA: Diagnosis not present

## 2023-08-03 DIAGNOSIS — I10 Essential (primary) hypertension: Secondary | ICD-10-CM | POA: Diagnosis not present

## 2023-08-03 MED ORDER — DILTIAZEM HCL ER COATED BEADS 120 MG PO CP24: 120.0000 mg | ORAL_CAPSULE | Freq: Every day | ORAL | 3 refills | Status: DC

## 2023-08-03 NOTE — Progress Notes (Signed)
 Cardiology Office Note:    Date:  08/03/2023  ID:  [name], DOB May 26, 1946, MRN [id number] PCP: Jonnie Kind, MD  [facility] Providers Cardiologist:  Kristeen Miss, MD       Patient Profile:      Atrial flutter  Monitor 07/2023: AFlutter 100% burden; Avg HR 122 Palpitations Hypertension  MVP TTE  02/06/15: mild LVH, EF 55-60, no RWMA, mild LAE, trivial MR Hx of ovarian CA          Discussed the use of AI scribe software for clinical note transcription with the patient, who gave verbal consent to proceed. History of Present Illness [name] is a 78 y.o. female who returns for evaluation of AFlutter. She was last seen in 09/2022. She called in recently with recurrent palpitations. We sent her a Zio monitor which showed new onset AFlutter 100% of the time with HRs in the 120s. I increased her beta-blocker and started her on anticoagulation with Eliquis. Since then, she has called in with BPs and HRs. Her HR remained higher and I increased her Metoprolol further. She is now on Eliquis 5 mg twice daily, Metoprolol tartrate 100 mg twice daily.   She notes continued palpitations. Over the past week, she has had chest pressure occurring with bending over and walking, but not when sitting. It is accompanied by a slight choking sensation in her throat and fatigue. These symptoms differ from previous palpitations, which were intermittent and less persistent. She also reports episodes of shortness of breath during walking, which has limited her ability to engage in her usual walking activities. No dizziness, passing out, or leg swelling. No chest discomfort at rest, and no radiation of pressure to her arms or back. No bleeding, black stool, or bloody urine since starting Eliquis.   ROS-See HPI     Studies Reviewed:   EKG Interpretation Date/Time:  Wednesday August 03 2023 14:59:26 EST Ventricular Rate:  136 PR Interval:    QRS Duration:  74 QT Interval:  206 QTC  Calculation: 310 R Axis:   -67  Text Interpretation: Atrial flutter with 2:1 A-V conduction Low voltage QRS Cannot rule out Anterolateral infarct Lateral injury pattern Confirmed by Tereso Newcomer [phone]) on 08/03/2023 3:07:10 PM    Results     Risk Assessment/Calculations:    CHA2DS2-VASc Score = 4   This indicates a 4.8% annual risk of stroke. The patient's score is based upon: CHF History: 0 HTN History: 1 Diabetes History: 0 Stroke History: 0 Vascular Disease History: 0 Age Score: 2 Gender Score: 1    HYPERTENSION CONTROL Vitals:   08/03/23 1452 08/03/23 1700  BP: (!) 164/120 (!) 160/120    The patient's blood pressure is elevated above target today.  In order to address the patient's elevated BP: A new medication was prescribed today.          Physical Exam:   VS:  BP (!) 160/120   Pulse (!) 136   Ht 5\' 9"  (1.753 m)   Wt 160 lb 6.4 oz (72.8 kg)   SpO2 94%   BMI 23.69 kg/m    Wt Readings from Last 3 Encounters:  08/03/23 160 lb 6.4 oz (72.8 kg)  10/27/22 161 lb 3.2 oz (73.1 kg)  09/28/21 159 lb 9.6 oz (72.4 kg)    Constitutional:      Appearance: Healthy appearance. Not in distress.  Neck:     Vascular: JVD normal.  Pulmonary:     Breath sounds:  Normal breath sounds. No wheezing. No rales.  Cardiovascular:     Tachycardia present. Irregularly irregular rhythm.     Murmurs: There is no murmur.  Edema:    Peripheral edema absent.  Abdominal:     Palpations: Abdomen is soft.        Assessment and Plan:   Assessment & Plan Atrial flutter, unspecified type (HCC) New onset. Recent monitor done for palpitations diagnosed her AFlutter. She had 100% burden on the monitor. HR was average 120s. Her HR remains significantly elevated. She is also symptomatic with exertional chest pain and shortness of breath. She has been on Eliquis for about 1 week. I have recommended proceeding with TEE DCCV. I reviewed her case with Dr. Jimmey Ralph (attending MD). He agrees. He  thinks her AFlutter is typical and will likely be amenable to ablation.  - Continue Eliquis 5 mg twice daily  - Continue Metoprolol tartrate 100 mg twice daily  - Start Diltiazem CD 120 mg once daily - BMET, CBC today - Advised her to take 1/2 dose of Metoprolol on the AM of her DCCV (50 mg) - Arrange follow up/EP eval with Dr. Jimmey Ralph 2-3 weeks Essential hypertension BP uncontrolled.  -Continue Metoprolol tartrate 100 mg twice daily, Losartan 100 mg once daily  -Start Diltiazem CD 120 mg once daily      Informed Consent   Shared Decision Making/Informed Consent   The risks [stroke, cardiac arrhythmias rarely resulting in the need for a temporary or permanent pacemaker, skin irritation or burns, esophageal damage, perforation (1:10,000 risk), bleeding, pharyngeal hematoma as well as other potential complications associated with conscious sedation including aspiration, arrhythmia, respiratory failure and death], benefits (treatment guidance, restoration of normal sinus rhythm, diagnostic support) and alternatives of a transesophageal echocardiogram guided cardioversion were discussed in detail with [name] and she is willing to proceed.     Dispo:  Return for Post Procedure Follow Up with Dr. Jimmey Ralph.  Signed, Tereso Newcomer, PA-C

## 2023-08-03 NOTE — Patient Instructions (Addendum)
 Medication Instructions:  Your physician has recommended you make the following change in your medication:   START Diltiazem 120 mg taking 1 daily  *If you need a refill on your cardiac medications before your next appointment, please call your pharmacy*   Lab Work: TODAY:  BMET & CBC  If you have labs (blood work) drawn today and your tests are completely normal, you will receive your results only by: MyChart Message (if you have MyChart) OR A paper copy in the mail If you have any lab test that is abnormal or we need to change your treatment, we will call you to review the results.   Testing/Procedures:     Dear [name]  You are scheduled for a TEE (Transesophageal Echocardiogram) Guided Cardioversion on Friday, March 7 with Dr. Jens Som.  Please arrive at the [facility] (Main Entrance A) at [facility]: [address] [place], [place] [address] at 7:00 AM (This time is 1.5 hour(s) before your procedure to ensure your preparation).   Free valet parking service is available. You will check in at ADMITTING.   *Please Note: You will receive a call the day before your procedure to confirm the appointment time. That time may have changed from the original time based on the schedule for that day.*    DIET:  Nothing to eat or drink after midnight except a sip of water with medications (see medication instructions below)  MEDICATION INSTRUCTIONS: !!IF ANY NEW MEDICATIONS ARE STARTED AFTER TODAY, PLEASE NOTIFY YOUR PROVIDER AS SOON AS POSSIBLE!!  FYI: Medications such as Semaglutide (Ozempic, [place]), Tirzepatide (Mounjaro, Zepbound), Dulaglutide (Trulicity), etc ("GLP1 agonists") AND Canagliflozin (Invokana), Dapagliflozin (Farxiga), Empagliflozin (Jardiance), Ertugliflozin (Steglatro), Bexagliflozin [employer]) or any combination with one of these drugs such as Invokamet (Canagliflozin/Metformin), Synjardy (Empagliflozin/Metformin), etc ("SGLT2 inhibitors") must be held  around the time of a procedure. This is not a comprehensive list of all of these drugs. Please review all of your medications and talk to your provider if you take any one of these. If you are not sure, ask your provider.    ON THE MORNING OF THE CARDIOVERSION, ONLY TAKE 1/2 OF THE METOPROLOL   Continue taking your anticoagulant (blood thinner): Apixaban (Eliquis).  You will need to continue this after your procedure until you are told by your provider that it is safe to stop.    LABS:  WILL BE DONE TODAY  FYI:  For your safety, and to allow [place] to monitor your vital signs accurately during the surgery/procedure we request: If you have artificial nails, gel coating, SNS etc, please have those removed prior to your surgery/procedure. Not having the nail coverings /polish removed may result in cancellation or delay of your surgery/procedure.  Your support person will be asked to wait in the waiting room during your procedure.  It is OK to have someone drop you off and come back when you are ready to be discharged.  You cannot drive after the procedure and will need someone to drive you home.  Bring your insurance cards.  *Special Note: Every effort is made to have your procedure done on time. Occasionally there are emergencies that occur at the hospital that may cause delays. Please be patient if a delay does occur.       Follow-Up: At [facility], you and your health needs are our priority.  As part of our continuing mission to provide you with exceptional heart care, we have created designated Provider Care Teams.  These Care Teams include your primary Cardiologist (physician) and Advanced Practice Providers (APPs -  Physician Assistants and Nurse Practitioners) who all work together to provide you with the care you need, when you need it.  We recommend signing up for the patient portal called "MyChart".  Sign up information is provided on this After Visit Summary.  MyChart is used  to connect with patients for Virtual Visits (Telemedicine).  Patients are able to view lab/test results, encounter notes, upcoming appointments, etc.  Non-urgent messages can be sent to your provider as well.   To learn more about what you can do with MyChart, go to [email or web address].    Your next appointment:   2 week(s)  Provider:   Kristeen Miss, MD  or Tereso Newcomer, PA-C         Other Instructions

## 2023-08-03 NOTE — Assessment & Plan Note (Signed)
 BP uncontrolled.  -Continue Metoprolol tartrate 100 mg twice daily, Losartan 100 mg once daily  -Start Diltiazem CD 120 mg once daily

## 2023-08-03 NOTE — Assessment & Plan Note (Signed)
 New onset. Recent monitor done for palpitations diagnosed her AFlutter. She had 100% burden on the monitor. HR was average 120s. Her HR remains significantly elevated. She is also symptomatic with exertional chest pain and shortness of breath. She has been on Eliquis for about 1 week. I have recommended proceeding with TEE DCCV. I reviewed her case with Dr. Jimmey Ralph (attending MD). He agrees. He thinks her AFlutter is typical and will likely be amenable to ablation.  - Continue Eliquis 5 mg twice daily  - Continue Metoprolol tartrate 100 mg twice daily  - Start Diltiazem CD 120 mg once daily - BMET, CBC today - Advised her to take 1/2 dose of Metoprolol on the AM of her DCCV (50 mg) - Arrange follow up/EP eval with Dr. Jimmey Ralph 2-3 weeks

## 2023-08-04 LAB — CBC
Hematocrit: 42.9 % (ref 34.0–46.6)
Hemoglobin: 14.2 g/dL (ref 11.1–15.9)
MCH: 32.1 pg (ref 26.6–33.0)
MCHC: 33.1 g/dL (ref 31.5–35.7)
MCV: 97 fL (ref 79–97)
Platelets: 291 10*3/uL (ref 150–450)
RBC: 4.42 x10E6/uL (ref 3.77–5.28)
RDW: 12.9 % (ref 11.7–15.4)
WBC: 7.7 10*3/uL (ref 3.4–10.8)

## 2023-08-04 LAB — BASIC METABOLIC PANEL
BUN/Creatinine Ratio: 24 (ref 12–28)
BUN: 16 mg/dL (ref 8–27)
CO2: 22 mmol/L (ref 20–29)
Calcium: 10.3 mg/dL (ref 8.7–10.3)
Chloride: 101 mmol/L (ref 96–106)
Creatinine, Ser: 0.66 mg/dL (ref 0.57–1.00)
Glucose: 92 mg/dL (ref 70–99)
Potassium: 5.2 mmol/L (ref 3.5–5.2)
Sodium: 141 mmol/L (ref 134–144)
eGFR: 91 mL/min/{1.73_m2} (ref >59)

## 2023-08-04 NOTE — Progress Notes (Signed)
Unable to reach patient about procedure, but was able to leave a detailed message. Stated that the patient needed to arrive at the hospital at 0730 , remain NPO after 0000, needs to have a ride home and a responsible adult to stay with them for 24 hours after the procedure. Instructed the patient to call back if they had any questions.

## 2023-08-05 ENCOUNTER — Encounter (HOSPITAL_COMMUNITY): Payer: Self-pay | Admitting: Cardiology

## 2023-08-05 ENCOUNTER — Ambulatory Visit (HOSPITAL_COMMUNITY)
Admission: RE | Admit: 2023-08-05 | Discharge: 2023-08-05 | Disposition: A | Discharge status: Home / Self Care | Attending: Cardiology | Admitting: Cardiology

## 2023-08-05 ENCOUNTER — Ambulatory Visit (HOSPITAL_COMMUNITY)
Admission: RE | Admit: 2023-08-05 | Discharge: 2023-08-05 | Disposition: A | Discharge status: Home / Self Care | Source: Ambulatory Visit | Attending: Physician Assistant | Admitting: Physician Assistant

## 2023-08-05 ENCOUNTER — Encounter (HOSPITAL_COMMUNITY)
Admission: RE | Disposition: A | Discharge status: Home / Self Care | Payer: Self-pay | Source: Home / Self Care | Attending: Cardiology

## 2023-08-05 ENCOUNTER — Other Ambulatory Visit: Payer: Self-pay

## 2023-08-05 ENCOUNTER — Ambulatory Visit (HOSPITAL_COMMUNITY): Admitting: Anesthesiology

## 2023-08-05 DIAGNOSIS — I080 Rheumatic disorders of both mitral and aortic valves: Secondary | ICD-10-CM | POA: Diagnosis not present

## 2023-08-05 DIAGNOSIS — I700 Atherosclerosis of aorta: Secondary | ICD-10-CM | POA: Insufficient documentation

## 2023-08-05 DIAGNOSIS — I4892 Unspecified atrial flutter: Secondary | ICD-10-CM | POA: Insufficient documentation

## 2023-08-05 DIAGNOSIS — Z79899 Other long term (current) drug therapy: Secondary | ICD-10-CM | POA: Insufficient documentation

## 2023-08-05 DIAGNOSIS — I483 Typical atrial flutter: Secondary | ICD-10-CM | POA: Diagnosis not present

## 2023-08-05 DIAGNOSIS — Z7901 Long term (current) use of anticoagulants: Secondary | ICD-10-CM | POA: Diagnosis not present

## 2023-08-05 DIAGNOSIS — I3139 Other pericardial effusion (noninflammatory): Secondary | ICD-10-CM | POA: Insufficient documentation

## 2023-08-05 DIAGNOSIS — I10 Essential (primary) hypertension: Secondary | ICD-10-CM | POA: Diagnosis not present

## 2023-08-05 HISTORY — PX: TRANSESOPHAGEAL ECHOCARDIOGRAM (CATH LAB): EP1270

## 2023-08-05 HISTORY — PX: CARDIOVERSION: EP1203

## 2023-08-05 LAB — ECHO TEE

## 2023-08-05 SURGERY — TRANSESOPHAGEAL ECHOCARDIOGRAM (TEE) (CATHLAB)
Anesthesia: Monitor Anesthesia Care

## 2023-08-05 MED ORDER — PROPOFOL 500 MG/50ML IV EMUL
INTRAVENOUS | Status: DC | PRN
  Administered 2023-08-05: 100 ug/kg/min via INTRAVENOUS

## 2023-08-05 MED ORDER — SODIUM CHLORIDE 0.9% FLUSH: 3.0000 mL | INTRAVENOUS | Status: DC | PRN

## 2023-08-05 MED ORDER — SODIUM CHLORIDE 0.9 % IV SOLN: INTRAVENOUS | Status: DC | PRN

## 2023-08-05 MED ORDER — PROPOFOL 10 MG/ML IV BOLUS
INTRAVENOUS | Status: DC | PRN
  Administered 2023-08-05: 30 mg via INTRAVENOUS
  Administered 2023-08-05 (×2): 20 mg via INTRAVENOUS

## 2023-08-05 MED ORDER — SODIUM CHLORIDE 0.9% FLUSH
3.0000 mL | Freq: Two times a day (BID) | INTRAVENOUS | Status: DC
Start: 2023-08-05 — End: 2023-08-05

## 2023-08-05 MED ORDER — LIDOCAINE 2% (20 MG/ML) 5 ML SYRINGE
INTRAMUSCULAR | Status: DC | PRN
  Administered 2023-08-05: 40 mg via INTRAVENOUS
  Administered 2023-08-05: 20 mg via INTRAVENOUS

## 2023-08-05 SURGICAL SUPPLY — 1 items: PAD DEFIB RADIO PHYSIO CONN (PAD) ×1 IMPLANT

## 2023-08-05 NOTE — Anesthesia Postprocedure Evaluation (Signed)
 Anesthesia Post Note  Patient: [name]  Procedure(s) Performed: TRANSESOPHAGEAL ECHOCARDIOGRAM CARDIOVERSION     Patient location during evaluation: Cath Lab Anesthesia Type: MAC Level of consciousness: awake and alert Pain management: pain level controlled Vital Signs Assessment: post-procedure vital signs reviewed and stable Respiratory status: spontaneous breathing, nonlabored ventilation, respiratory function stable and patient connected to nasal cannula oxygen Cardiovascular status: stable and blood pressure returned to baseline Postop Assessment: no apparent nausea or vomiting Anesthetic complications: no   No notable events documented.  Last Vitals:  Vitals:   08/05/23 0840 08/05/23 0845  BP: 104/75 105/78  Pulse: 65 63  Resp: (!) 27 20  Temp:    SpO2: 95% 95%    Last Pain:  Vitals:   08/05/23 0829  TempSrc: Temporal  PainSc: 0-No pain                 Mayford Alberg S

## 2023-08-05 NOTE — Anesthesia Preprocedure Evaluation (Signed)
 Anesthesia Evaluation  Patient identified by MRN, date of birth, ID band Patient awake    Reviewed: Allergy & Precautions, NPO status , Patient's Chart, lab work & pertinent test results  Airway Mallampati: I  TM Distance: >3 FB Neck ROM: Full    Dental no notable dental hx. (+) Dental Advisory Given, Teeth Intact   Pulmonary neg pulmonary ROS   Pulmonary exam normal breath sounds clear to auscultation       Cardiovascular hypertension, + dysrhythmias  Rhythm:Regular Rate:Tachycardia     Neuro/Psych negative neurological ROS     GI/Hepatic negative GI ROS, Neg liver ROS,,,  Endo/Other  negative endocrine ROS    Renal/GU negative Renal ROS     Musculoskeletal negative musculoskeletal ROS (+)    Abdominal   Peds  Hematology negative hematology ROS (+)   Anesthesia Other Findings   Reproductive/Obstetrics                             Anesthesia Physical Anesthesia Plan  ASA: 2  Anesthesia Plan: MAC   Post-op Pain Management: Minimal or no pain anticipated   Induction: Intravenous  PONV Risk Score and Plan: 2 and Propofol infusion, TIVA and Treatment may vary due to age or medical condition  Airway Management Planned:   Additional Equipment:   Intra-op Plan:   Post-operative Plan:   Informed Consent: I have reviewed the patients History and Physical, chart, labs and discussed the procedure including the risks, benefits and alternatives for the proposed anesthesia with the patient or authorized representative who has indicated his/her understanding and acceptance.     Dental advisory given  Plan Discussed with: CRNA  Anesthesia Plan Comments:        Anesthesia Quick Evaluation

## 2023-08-05 NOTE — Interval H&P Note (Signed)
 History and Physical Interval Note:  08/05/2023 7:39 AM  [name]  has presented today for surgery, with the diagnosis of AFLUTTER.  The various methods of treatment have been discussed with the patient and family. After consideration of risks, benefits and other options for treatment, the patient has consented to  Procedure(s): TRANSESOPHAGEAL ECHOCARDIOGRAM (N/A) CARDIOVERSION (N/A) as a surgical intervention.  The patient's history has been reviewed, patient examined, no change in status, stable for surgery.  I have reviewed the patient's chart and labs.  Questions were answered to the patient's satisfaction.     Olga Millers

## 2023-08-05 NOTE — Progress Notes (Signed)
     Transesophageal Echocardiogram Note  [name] [id number] 1945-06-20  Procedure: Transesophageal Echocardiogram Indications: Atrial flutter  Procedure Details Consent: Obtained Time Out: Verified patient identification, verified procedure, site/side was marked, verified correct patient position, special equipment/implants available, Radiology Safety Procedures followed,  medications/allergies/relevent history reviewed, required imaging and test results available.  Performed  Medications:  Pt sedated by anesthesia with lidocaine 60 mg and diprovan 80 mg IV total.  Mild LV dysfunction (EF 40-45); moderate biatrial enlargement; no LAA thrombus; mild AI, MR and TR.  Pt subsequently underwent successful DCCV with 200J to sinus rhythm; no immediate complications; continue apixaban.   Complications: No apparent complications Patient did tolerate procedure well.  Olga Millers, MD

## 2023-08-05 NOTE — Transfer of Care (Signed)
 Immediate Anesthesia Transfer of Care Note  Patient: [name]  Procedure(s) Performed: TRANSESOPHAGEAL ECHOCARDIOGRAM CARDIOVERSION  Patient Location: Cath Lab  Anesthesia Type:MAC  Level of Consciousness: awake and patient cooperative  Airway & Oxygen Therapy: Patient Spontanous Breathing and Patient connected to nasal cannula oxygen  Post-op Assessment: Report given to RN, Post -op Vital signs reviewed and stable, Patient moving all extremities, Patient moving all extremities X 4, and Patient able to stick tongue midline  Post vital signs: Reviewed and stable  Last Vitals:  Vitals Value Taken Time  BP 85/60 08/05/23 0829  Temp 36.7 C 08/05/23 0829  Pulse 61 08/05/23 0830  Resp 19 08/05/23 0830  SpO2 91 % 08/05/23 0830  Vitals shown include unfiled device data.  Last Pain:  Vitals:   08/05/23 0829  TempSrc: Temporal  PainSc: 0-No pain         Complications: No notable events documented.

## 2023-08-05 NOTE — H&P (Signed)
 Office Visit 08/03/2023 [facility] at [facility]    [place], [place] T, [place] [facility] Atrial flutter, unspecified type [facility]) +1 more Dx Atrial Flutter; Referred by Diane Kind, MD Reason for Visit   Additional Documentation  Vitals: BP 160/120 Important    Pulse 136 Important    Ht 5\' 9"  (1.753 m)   Wt 72.8 kg   SpO2 94%   BMI 23.69 kg/m   BSA 1.88 m      More Vitals  Flowsheets: MEWS Score,   Anthropometrics,   NEWS,   Vital Signs,   Data,   CHADS2-VASc Score,   HTN Metric  Encounter Info: Billing Info,   History,   Allergies,   Detailed Report   All Notes   Progress Notes by Diane Lecher, PA-C at 08/03/2023 2:45 PM  Author: Beatrice Lecher, PA-C Author Type: Physician Assistant Filed: 08/03/2023  5:00 PM  Note Status: Signed Cosign: Cosigned by Diane Putnam, MD at 08/04/2023  9:20 PM Encounter Date: 08/03/2023  Editor: [name] (Physician Assistant)             Expand All Collapse All      Cardiology Office Note:     Date:  08/03/2023  ID:  [name], DOB 1945-07-19, MRN [id number] PCP: Diane Kind, MD  [facility] Providers Cardiologist:  Diane Miss, MD       Patient Profile:     Narrative History Atrial flutter  Monitor 07/2023: AFlutter 100% burden; Avg HR 122 Palpitations Hypertension  MVP TTE  02/06/15: mild LVH, EF 55-60, no RWMA, mild LAE, trivial MR Hx of ovarian CA     Subjective    Discussed the use of AI scribe software for clinical note transcription with the patient, who gave verbal consent to proceed. History of Present Illness [name] is a 78 y.o. female who returns for evaluation of AFlutter. She was last seen in 09/2022. She called in recently with recurrent palpitations. We sent her a Zio monitor which showed new onset AFlutter 100% of the time with HRs in the 120s. I increased her beta-blocker and started her on anticoagulation with Eliquis. Since then, she  has called in with BPs and HRs. Her HR remained higher and I increased her Metoprolol further. She is now on Eliquis 5 mg twice daily, Metoprolol tartrate 100 mg twice daily.    She notes continued palpitations. Over the past week, she has had chest pressure occurring with bending over and walking, but not when sitting. It is accompanied by a slight choking sensation in her throat and fatigue. These symptoms differ from previous palpitations, which were intermittent and less persistent. She also reports episodes of shortness of breath during walking, which has limited her ability to engage in her usual walking activities. No dizziness, passing out, or leg swelling. No chest discomfort at rest, and no radiation of pressure to her arms or back. No bleeding, black stool, or bloody urine since starting Eliquis.     ROS-See HPI  Objective[] Expand by Default Studies Reviewed:   EKG Interpretation Date/Time:                  Wednesday August 03 2023 14:59:26 EST Ventricular Rate:         136 PR Interval:                   QRS Duration:  74 QT Interval:                 206 QTC Calculation:310 R Axis:                         -67   Text Interpretation:Atrial flutter with 2:1 A-V conduction Low voltage QRS Cannot rule out Anterolateral infarct Lateral injury pattern Confirmed by [name] [phone]) on 08/03/2023 3:07:10 PM     Results     Risk Assessment/Calculations:     CHA2DS2-VASc Score = 4   This indicates a 4.8% annual risk of stroke. The patient's score is based upon: CHF History: 0 HTN History: 1 Diabetes History: 0 Stroke History: 0 Vascular Disease History: 0 Age Score: 2 Gender Score: 1     HYPERTENSION CONTROL     Vitals:    08/03/23 1452 08/03/23 1700  BP: (!) 164/120 (!) 160/120    The patient's blood pressure is elevated above target today.   In order to address the patient's elevated BP: A new medication was prescribed today.          Physical Exam:    VS:  BP (!) 160/120   Pulse (!) 136   Ht 5\' 9"  (1.753 m)   Wt 160 lb 6.4 oz (72.8 kg)   SpO2 94%   BMI 23.69 kg/m       Wt Readings from Last 3 Encounters:  08/03/23 160 lb 6.4 oz (72.8 kg)  10/27/22 161 lb 3.2 oz (73.1 kg)  09/28/21 159 lb 9.6 oz (72.4 kg)    Constitutional:      Appearance: Healthy appearance. Not in distress.  Neck:     Vascular: JVD normal.  Pulmonary:     Breath sounds: Normal breath sounds. No wheezing. No rales.  Cardiovascular:     Tachycardia present. Irregularly irregular rhythm.     Murmurs: There is no murmur.  Edema:    Peripheral edema absent.  Abdominal:     Palpations: Abdomen is soft.          Assessment and Plan:    Assessment & Plan Atrial flutter, unspecified type (HCC) New onset. Recent monitor done for palpitations diagnosed her AFlutter. She had 100% burden on the monitor. HR was average 120s. Her HR remains significantly elevated. She is also symptomatic with exertional chest pain and shortness of breath. She has been on Eliquis for about 1 week. I have recommended proceeding with TEE DCCV. I reviewed her case with Dr. Jimmey [name] (attending MD). He agrees. He thinks her AFlutter is typical and will likely be amenable to ablation.  - Continue Eliquis 5 mg twice daily  - Continue Metoprolol tartrate 100 mg twice daily  - Start Diltiazem CD 120 mg once daily - BMET, CBC today - Advised her to take 1/2 dose of Metoprolol on the AM of her DCCV (50 mg) - Arrange follow up/EP eval with Dr. Jimmey [name] 2-3 weeks Essential hypertension BP uncontrolled.  -Continue Metoprolol tartrate 100 mg twice daily, Losartan 100 mg once daily  -Start Diltiazem CD 120 mg once daily     Informed Consent Shared Decision Making/Informed Consent   The risks [stroke, cardiac arrhythmias rarely resulting in the need for a temporary or permanent pacemaker, skin irritation or burns, esophageal damage, perforation (1:10,000 risk), bleeding, pharyngeal hematoma as  well as other potential complications associated with conscious sedation including aspiration, arrhythmia, respiratory failure and death], benefits (treatment guidance, restoration of normal sinus rhythm, diagnostic support)  and alternatives of a transesophageal echocardiogram guided cardioversion were discussed in detail with Ms. Ghuman and she is willing to proceed.      Dispo:  Return for Post Procedure Follow Up with Dr. Jimmey [name].   Signed, Diane Newcomer, PA-C        For TEE/DCCV; no changes; compliant with apixaban. Olga Millers

## 2023-08-07 ENCOUNTER — Encounter (HOSPITAL_COMMUNITY): Payer: Self-pay | Admitting: Cardiology

## 2023-08-19 ENCOUNTER — Ambulatory Visit: Admitting: Physician Assistant

## 2023-08-23 ENCOUNTER — Ambulatory Visit: Admitting: Cardiology

## 2023-09-08 NOTE — Progress Notes (Unsigned)
 Electrophysiology Office Note:   Date:  09/09/2023  ID:  [name], DOB 05-08-1946, MRN [id number]  Primary Cardiologist: Kristeen Miss, MD Electrophysiologist: Nobie Putnam, MD      History of Present Illness:   [name] is a 78 y.o. female with h/o hypertension, mitral valve prolapse, history of ovarian cancer and atrial flutter who is being seen seen today for evaluation for catheter ablation at the request of Tereso Newcomer, [place].  Discussed the use of AI scribe software for clinical note transcription with the patient, who gave verbal consent to proceed.  History of Present Illness She has been experiencing persistent arrhythmia since the beginning of the year, following a stomach virus and chest congestion. She has a history of heart palpitations since 2016 or 2017, which were previously controlled with metoprolol. However, her current symptoms feel different and more persistent.She underwent a cardioversion procedure in March, which initially restored her heart rhythm, but she reverted back to an abnormal rhythm within a week. She currently endorses palpitations and fatigue, also feeling jittery, especially when resting. Her current medication regimen includes metoprolol, which was previously effective in controlling her palpitations. She is currently on a dose of 75 mg daily, but was previously on 100 mg twice a day. She is also on a blood thinner. She wants to avoid long-term medication use if possible.  Review of systems complete and found to be negative unless listed in HPI.   EP Information / Studies Reviewed:    EKG is not ordered today. EKG from 08/05/23 reviewed which showed sinus rhythm with PACs.  EKG Interpretation Date/Time:  Friday September 09 2023 09:38:50 EDT Ventricular Rate:  135 PR Interval:  80 QRS Duration:  138 QT Interval:  362 QTC Calculation: 543 R Axis:   -2  Text Interpretation: Atrial flutter with 2 to 1 block Cannot rule out Septal infarct (cited  on or before 01-Sep-2007) When compared with ECG of 05-Aug-2023 08:39,atrial flutter has replaced sinus rhythm. Confirmed by Nobie Putnam [phone]) on 09/09/2023 9:43:17 AM   EKG 08/03/23: AFL   TEE 08/05/2023:  1. Pt subsequently underwent successful cardioversion with 200J to sinus  rhythm; no immediate complications.   2. Left ventricular ejection fraction, by estimation, is 40 to 45%. The  left ventricle has mildly decreased function. The left ventricle  demonstrates global hypokinesis.   3. Right ventricular systolic function is normal. The right ventricular  size is normal.   4. Left atrial size was moderately dilated. No left atrial/left atrial  appendage thrombus was detected.   5. Right atrial size was moderately dilated.   6. A small pericardial effusion is present.   7. The mitral valve is normal in structure. Mild mitral valve  regurgitation.   8. The aortic valve is tricuspid. Aortic valve regurgitation is mild. No  aortic stenosis is present.   9. There is mild (Grade II) plaque involving the descending aorta.   Risk Assessment/Calculations:    CHA2DS2-VASc Score = 4   This indicates a 4.8% annual risk of stroke. The patient's score is based upon: CHF History: 0 HTN History: 1 Diabetes History: 0 Stroke History: 0 Vascular Disease History: 0 Age Score: 2 Gender Score: 1      Physical Exam:   VS:  BP (!) 138/96   Pulse (!) 136   Ht 5\' 9"  (1.753 m)   Wt 156 lb 9.6 oz (71 kg)   SpO2 99%   BMI 23.13 kg/m    Wt Readings from  Last 3 Encounters:  09/09/23 156 lb 9.6 oz (71 kg)  08/03/23 160 lb 6.4 oz (72.8 kg)  10/27/22 161 lb 3.2 oz (73.1 kg)     GEN: Well nourished, well developed in no acute distress NECK: No JVD CARDIAC: Tachycardic, regular. RESPIRATORY:  Clear to auscultation without rales, wheezing or rhonchi  ABDOMEN: Soft, non-distended EXTREMITIES:  No edema; No deformity   ASSESSMENT AND PLAN:    #. Atrial flutter, likely typical: She is  quite symptomatic.  She had recurrence quickly after cardioversion.  Associated with decline in LVEF, TEE while in atrial flutter showed EF 40 to 45%.  For this reason, we have prioritize rhythm control. -Discussed treatment options today for AFL including antiarrhythmic drug therapy and ablation. Discussed risks, recovery and likelihood of success with each treatment strategy. Risk, benefits, and alternatives to EP study and ablation for AFL were discussed. These risks include but are not limited to stroke, bleeding, vascular damage, tamponade, perforation, damage to the esophagus, lungs, phrenic nerve and other structures, worsening renal function, coronary vasospasm and death.  Discussed potential need for repeat ablation procedures and antiarrhythmic drugs after an initial ablation. The patient understands these risk and wishes to think about her options.  If she chooses to proceed with ablation, then we will obtain standard CT scan beforehand.  Would also recommend implanting loop recorder at time of ablation to monitor for atrial fibrillation. -Patient is poorly rate controlled today.  She was on metoprolol XL 100 mg twice daily prior to her cardioversion for rate control.  We will return to that dose well she contemplates antiarrhythmic drug therapy versus catheter ablation.  #. 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  - Continue Eliquis 5mg  BID.   #Hypertension -Above goal today.  Recommend checking blood pressures 1-2 times per week at home and recording the values.  Recommend bringing these recordings to the primary care physician.  Follow up in 2 weeks.   Signed, Nobie Putnam, MD

## 2023-09-08 NOTE — H&P (View-Only) (Signed)
 Electrophysiology Office Note:   Date:  09/09/2023  ID:  YVONNE LEA, DOB May 25, 1946, MRN [id number]  Primary Cardiologist: Ahmad Alert, MD Electrophysiologist: Ardeen Kohler, MD      History of Present Illness:   [name] is a 78 y.o. female with h/o hypertension, mitral valve prolapse, history of ovarian cancer and atrial flutter who is being seen seen today for evaluation for catheter ablation at the request of Marlyse Single, [place].  Discussed the use of AI scribe software for clinical note transcription with the patient, who gave verbal consent to proceed.  History of Present Illness She has been experiencing persistent arrhythmia since the beginning of the year, following a stomach virus and chest congestion. She has a history of heart palpitations since 2016 or 2017, which were previously controlled with metoprolol . However, her current symptoms feel different and more persistent.She underwent a cardioversion procedure in March, which initially restored her heart rhythm, but she reverted back to an abnormal rhythm within a week. She currently endorses palpitations and fatigue, also feeling jittery, especially when resting. Her current medication regimen includes metoprolol , which was previously effective in controlling her palpitations. She is currently on a dose of 75 mg daily, but was previously on 100 mg twice a day. She is also on a blood thinner. She wants to avoid long-term medication use if possible.  Review of systems complete and found to be negative unless listed in HPI.   EP Information / Studies Reviewed:    EKG is not ordered today. EKG from 08/05/23 reviewed which showed sinus rhythm with PACs.  EKG Interpretation Date/Time:  Friday September 09 2023 09:38:50 EDT Ventricular Rate:  135 PR Interval:  80 QRS Duration:  138 QT Interval:  362 QTC Calculation: 543 R Axis:   -2  Text Interpretation: Atrial flutter with 2 to 1 block Cannot rule out Septal infarct (cited  on or before 01-Sep-2007) When compared with ECG of 05-Aug-2023 08:39,atrial flutter has replaced sinus rhythm. Confirmed by Ardeen Kohler [phone]) on 09/09/2023 9:43:17 AM   EKG 08/03/23: AFL   TEE 08/05/2023:  1. Pt subsequently underwent successful cardioversion with 200J to sinus  rhythm; no immediate complications.   2. Left ventricular ejection fraction, by estimation, is 40 to 45%. The  left ventricle has mildly decreased function. The left ventricle  demonstrates global hypokinesis.   3. Right ventricular systolic function is normal. The right ventricular  size is normal.   4. Left atrial size was moderately dilated. No left atrial/left atrial  appendage thrombus was detected.   5. Right atrial size was moderately dilated.   6. A small pericardial effusion is present.   7. The mitral valve is normal in structure. Mild mitral valve  regurgitation.   8. The aortic valve is tricuspid. Aortic valve regurgitation is mild. No  aortic stenosis is present.   9. There is mild (Grade II) plaque involving the descending aorta.   Risk Assessment/Calculations:    CHA2DS2-VASc Score = 4   This indicates a 4.8% annual risk of stroke. The patient's score is based upon: CHF History: 0 HTN History: 1 Diabetes History: 0 Stroke History: 0 Vascular Disease History: 0 Age Score: 2 Gender Score: 1      Physical Exam:   VS:  BP (!) 138/96   Pulse (!) 136   Ht 5\' 9"  (1.753 m)   Wt 156 lb 9.6 oz (71 kg)   SpO2 99%   BMI 23.13 kg/m    Wt Readings from  Last 3 Encounters:  09/09/23 156 lb 9.6 oz (71 kg)  08/03/23 160 lb 6.4 oz (72.8 kg)  10/27/22 161 lb 3.2 oz (73.1 kg)     GEN: Well nourished, well developed in no acute distress NECK: No JVD CARDIAC: Tachycardic, regular. RESPIRATORY:  Clear to auscultation without rales, wheezing or rhonchi  ABDOMEN: Soft, non-distended EXTREMITIES:  No edema; No deformity   ASSESSMENT AND PLAN:    #. Atrial flutter, likely typical: She is  quite symptomatic.  She had recurrence quickly after cardioversion.  Associated with decline in LVEF, TEE while in atrial flutter showed EF 40 to 45%.  For this reason, we have prioritize rhythm control. -Discussed treatment options today for AFL including antiarrhythmic drug therapy and ablation. Discussed risks, recovery and likelihood of success with each treatment strategy. Risk, benefits, and alternatives to EP study and ablation for AFL were discussed. These risks include but are not limited to stroke, bleeding, vascular damage, tamponade, perforation, damage to the esophagus, lungs, phrenic nerve and other structures, worsening renal function, coronary vasospasm and death.  Discussed potential need for repeat ablation procedures and antiarrhythmic drugs after an initial ablation. The patient understands these risk and wishes to think about her options.  If she chooses to proceed with ablation, then we will obtain standard CT scan beforehand.  Would also recommend implanting loop recorder at time of ablation to monitor for atrial fibrillation. -Patient is poorly rate controlled today.  She was on metoprolol  XL 100 mg twice daily prior to her cardioversion for rate control.  We will return to that dose well she contemplates antiarrhythmic drug therapy versus catheter ablation.  #. 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  - Continue Eliquis  5mg  BID.   #Hypertension -Above goal today.  Recommend checking blood pressures 1-2 times per week at home and recording the values.  Recommend bringing these recordings to the primary care physician.  Follow up in 2 weeks.   Signed, Ardeen Kohler, MD

## 2023-09-09 ENCOUNTER — Ambulatory Visit: Attending: Cardiology | Admitting: Cardiology

## 2023-09-09 ENCOUNTER — Encounter: Payer: Self-pay | Admitting: Cardiology

## 2023-09-09 VITALS — BP 138/96 | HR 136 | Ht 69.0 in | Wt 156.6 lb

## 2023-09-09 DIAGNOSIS — I483 Typical atrial flutter: Secondary | ICD-10-CM

## 2023-09-09 DIAGNOSIS — R000 Tachycardia, unspecified: Secondary | ICD-10-CM

## 2023-09-09 DIAGNOSIS — I10 Essential (primary) hypertension: Secondary | ICD-10-CM

## 2023-09-09 DIAGNOSIS — D6869 Other thrombophilia: Secondary | ICD-10-CM

## 2023-09-09 MED ORDER — METOPROLOL SUCCINATE ER 100 MG PO TB24: 100.0000 mg | ORAL_TABLET | Freq: Two times a day (BID) | ORAL | 3 refills | Status: DC

## 2023-09-09 NOTE — Patient Instructions (Addendum)
 Medication Instructions:  Your physician has recommended you make the following change in your medication:  1) STOP taking metoprolol tartrate 2) START taking metoprolol succinate (Toprol XL) 100 mg twice daily  *If you need a refill on your cardiac medications before your next appointment, please call your pharmacy*  Testing/Procedures: Ablation Your physician has recommended that you have an ablation. Catheter ablation is a medical procedure used to treat some cardiac arrhythmias (irregular heartbeats). During catheter ablation, a long, thin, flexible tube is put into a blood vessel in your groin (upper thigh), or neck. This tube is called an ablation catheter. It is then guided to your heart through the blood vessel. Radio frequency waves destroy small areas of heart tissue where abnormal heartbeats may cause an arrhythmia to start.  Call us if you decided that you would like to schedule.   Follow-Up: At [facility], you and your health needs are our priority.  As part of our continuing mission to provide you with exceptional heart care, we have created designated Provider Care Teams.  These Care Teams include your primary Cardiologist (physician) and Advanced Practice Providers (APPs -  Physician Assistants and Nurse Practitioners) who all work together to provide you with the care you need, when you need it.

## 2023-09-19 ENCOUNTER — Telehealth (HOSPITAL_COMMUNITY): Payer: Self-pay

## 2023-09-19 DIAGNOSIS — I483 Typical atrial flutter: Secondary | ICD-10-CM

## 2023-09-19 NOTE — Telephone Encounter (Signed)
 Attempted to reach patient to discuss upcoming procedure, no answer. Left VM for patient to return call.

## 2023-09-20 ENCOUNTER — Ambulatory Visit: Admission: RE | Admit: 2023-09-20 | Source: Ambulatory Visit

## 2023-09-22 ENCOUNTER — Ambulatory Visit (HOSPITAL_COMMUNITY): Admission: RE | Admit: 2023-09-22 | Source: Home / Self Care | Admitting: Internal Medicine

## 2023-09-22 ENCOUNTER — Encounter (HOSPITAL_COMMUNITY): Admission: RE | Payer: Self-pay | Source: Home / Self Care

## 2023-09-22 DIAGNOSIS — I483 Typical atrial flutter: Secondary | ICD-10-CM

## 2023-09-22 LAB — BASIC METABOLIC PANEL WITH GFR
BUN/Creatinine Ratio: 17 (ref 12–28)
BUN: 13 mg/dL (ref 8–27)
CO2: 25 mmol/L (ref 20–29)
Calcium: 9.6 mg/dL (ref 8.7–10.3)
Chloride: 101 mmol/L (ref 96–106)
Creatinine, Ser: 0.77 mg/dL (ref 0.57–1.00)
Glucose: 82 mg/dL (ref 70–99)
Potassium: 4.6 mmol/L (ref 3.5–5.2)
Sodium: 139 mmol/L (ref 134–144)
eGFR: 80 mL/min/{1.73_m2} (ref >59)

## 2023-09-22 LAB — CBC
Hematocrit: 44.6 % (ref 34.0–46.6)
Hemoglobin: 13.9 g/dL (ref 11.1–15.9)
MCH: 30.5 pg (ref 26.6–33.0)
MCHC: 31.2 g/dL — ABNORMAL LOW (ref 31.5–35.7)
MCV: 98 fL — ABNORMAL HIGH (ref 79–97)
Platelets: 263 10*3/uL (ref 150–450)
RBC: 4.55 x10E6/uL (ref 3.77–5.28)
RDW: 13.2 % (ref 11.7–15.4)
WBC: 5.4 10*3/uL (ref 3.4–10.8)

## 2023-09-22 SURGERY — CARDIOVERSION (CATH LAB)
Anesthesia: Monitor Anesthesia Care

## 2023-09-23 NOTE — Pre-Procedure Instructions (Signed)
Attempted to call patient regarding procedure instructions.  Left voicemail on the following items: Arrival time 1030 Nothing to eat or drink after midnight No meds AM of procedure Responsible person to drive you home and stay with you for 24 hrs

## 2023-09-26 ENCOUNTER — Ambulatory Visit (HOSPITAL_COMMUNITY): Admitting: Certified Registered"

## 2023-09-26 ENCOUNTER — Encounter (HOSPITAL_COMMUNITY): Payer: Self-pay | Admitting: Cardiology

## 2023-09-26 ENCOUNTER — Ambulatory Visit (HOSPITAL_COMMUNITY)
Admission: RE | Admit: 2023-09-26 | Discharge: 2023-09-26 | Disposition: A | Discharge status: Home / Self Care | Attending: Cardiology | Admitting: Cardiology

## 2023-09-26 ENCOUNTER — Other Ambulatory Visit: Payer: Self-pay

## 2023-09-26 ENCOUNTER — Ambulatory Visit (HOSPITAL_COMMUNITY)
Admission: RE | Disposition: A | Discharge status: Home / Self Care | Payer: Self-pay | Source: Home / Self Care | Attending: Cardiology

## 2023-09-26 DIAGNOSIS — I10 Essential (primary) hypertension: Secondary | ICD-10-CM | POA: Insufficient documentation

## 2023-09-26 DIAGNOSIS — I483 Typical atrial flutter: Secondary | ICD-10-CM | POA: Diagnosis not present

## 2023-09-26 DIAGNOSIS — I4892 Unspecified atrial flutter: Secondary | ICD-10-CM | POA: Diagnosis present

## 2023-09-26 DIAGNOSIS — D6869 Other thrombophilia: Secondary | ICD-10-CM | POA: Insufficient documentation

## 2023-09-26 DIAGNOSIS — Z7901 Long term (current) use of anticoagulants: Secondary | ICD-10-CM | POA: Diagnosis not present

## 2023-09-26 HISTORY — PX: A-FLUTTER ABLATION: EP1230

## 2023-09-26 SURGERY — A-FLUTTER ABLATION
Anesthesia: Monitor Anesthesia Care

## 2023-09-26 MED ORDER — LIDOCAINE 2% (20 MG/ML) 5 ML SYRINGE
INTRAMUSCULAR | Status: DC | PRN
  Administered 2023-09-26: 50 mg via INTRAVENOUS

## 2023-09-26 MED ORDER — ONDANSETRON HCL 4 MG/2ML IJ SOLN: 4.0000 mg | Freq: Four times a day (QID) | INTRAMUSCULAR | Status: DC | PRN

## 2023-09-26 MED ORDER — ACETAMINOPHEN 325 MG PO TABS: 650.0000 mg | ORAL_TABLET | ORAL | Status: DC | PRN

## 2023-09-26 MED ORDER — PROPOFOL 1000 MG/100ML IV EMUL
INTRAVENOUS | Status: AC
  Filled 2023-09-26: qty 100

## 2023-09-26 MED ORDER — PROPOFOL 500 MG/50ML IV EMUL
INTRAVENOUS | Status: DC | PRN
Start: 2023-09-26 — End: 2023-09-27
  Administered 2023-09-26: 100 ug/kg/min via INTRAVENOUS

## 2023-09-26 MED ORDER — FENTANYL CITRATE (PF) 100 MCG/2ML IJ SOLN
INTRAMUSCULAR | Status: DC | PRN
Start: 2023-09-26 — End: 2023-09-27
  Administered 2023-09-26 (×2): 25 ug via INTRAVENOUS
  Administered 2023-09-26: 50 ug via INTRAVENOUS

## 2023-09-26 MED ORDER — ONDANSETRON HCL 4 MG/2ML IJ SOLN
INTRAMUSCULAR | Status: DC | PRN
  Administered 2023-09-26: 4 mg via INTRAVENOUS

## 2023-09-26 MED ORDER — APIXABAN 5 MG PO TABS
5.0000 mg | ORAL_TABLET | Freq: Once | ORAL | Status: AC
  Administered 2023-09-26: 5 mg via ORAL
  Filled 2023-09-26: qty 1

## 2023-09-26 MED ORDER — HEPARIN (PORCINE) IN NACL 1000-0.9 UT/500ML-% IV SOLN
INTRAVENOUS | Status: DC | PRN
  Administered 2023-09-26: 500 mL

## 2023-09-26 MED ORDER — PROPOFOL 10 MG/ML IV BOLUS
INTRAVENOUS | Status: DC | PRN
  Administered 2023-09-26 (×3): 20 mg via INTRAVENOUS
  Administered 2023-09-26: 15 mg via INTRAVENOUS

## 2023-09-26 MED ORDER — METOPROLOL SUCCINATE ER 100 MG PO TB24: 50.0000 mg | ORAL_TABLET | Freq: Two times a day (BID) | ORAL | 3 refills | Status: AC

## 2023-09-26 MED ORDER — DEXMEDETOMIDINE HCL IN NACL 80 MCG/20ML IV SOLN
INTRAVENOUS | Status: AC
  Filled 2023-09-26: qty 20

## 2023-09-26 MED ORDER — FENTANYL CITRATE (PF) 100 MCG/2ML IJ SOLN
INTRAMUSCULAR | Status: AC
  Filled 2023-09-26: qty 2

## 2023-09-26 MED ORDER — SODIUM CHLORIDE 0.9 % IV SOLN: INTRAVENOUS | Status: DC

## 2023-09-26 MED ORDER — METOPROLOL SUCCINATE ER 50 MG PO TB24
50.0000 mg | ORAL_TABLET | Freq: Once | ORAL | Status: AC
  Administered 2023-09-26: 50 mg via ORAL
  Filled 2023-09-26 (×2): qty 1

## 2023-09-26 MED ORDER — HEPARIN SODIUM (PORCINE) 1000 UNIT/ML IJ SOLN
INTRAMUSCULAR | Status: AC
  Filled 2023-09-26: qty 10

## 2023-09-26 MED ORDER — EPHEDRINE SULFATE-NACL 50-0.9 MG/10ML-% IV SOSY
PREFILLED_SYRINGE | INTRAVENOUS | Status: DC | PRN
  Administered 2023-09-26: 7.5 mg via INTRAVENOUS

## 2023-09-26 MED ORDER — PHENYLEPHRINE HCL-NACL 20-0.9 MG/250ML-% IV SOLN
INTRAVENOUS | Status: DC | PRN
  Administered 2023-09-26: 40 ug/min via INTRAVENOUS

## 2023-09-26 MED ORDER — DEXMEDETOMIDINE HCL IN NACL 80 MCG/20ML IV SOLN
INTRAVENOUS | Status: DC | PRN
  Administered 2023-09-26: 8 ug via INTRAVENOUS

## 2023-09-26 SURGICAL SUPPLY — 15 items
BAG SNAP BAND KOVER 36X36 (MISCELLANEOUS) IMPLANT
CATH ABLAT QDOT MICRO BI TC DF (CATHETERS) IMPLANT
CATH BI DIR 7FR CS F-J 12 PIN (CATHETERS) IMPLANT
CATH GE 8FR SOUNDSTAR (CATHETERS) IMPLANT
CATH MAPPNG PENTARAY F 2-6-2MM (CATHETERS) IMPLANT
DEVICE CLOSURE MYNXGRIP 6/7F (Vascular Products) IMPLANT
PACK EP LF (CUSTOM PROCEDURE TRAY) ×1 IMPLANT
PACK LOOP INSERTION (CUSTOM PROCEDURE TRAY) ×1 IMPLANT
PAD DEFIB RADIO PHYSIO CONN (PAD) ×1 IMPLANT
PATCH CARTO3 (PAD) IMPLANT
SHEATH AVANTI 11CM 9FR (SHEATH) IMPLANT
SHEATH CARTO VIZIGO MED CURVE (SHEATH) IMPLANT
SHEATH PINNACLE 8F 10CM (SHEATH) IMPLANT
SHEATH PROBE COVER 6X72 (BAG) IMPLANT
TUBING SMART ABLATE COOLFLOW (TUBING) IMPLANT

## 2023-09-26 NOTE — Anesthesia Preprocedure Evaluation (Addendum)
 Anesthesia Evaluation  Patient identified by MRN, date of birth, ID band Patient awake    Reviewed: Allergy & Precautions, NPO status , Patient's Chart, lab work & pertinent test results  Airway Mallampati: II  TM Distance: >3 FB Neck ROM: Full    Dental no notable dental hx.    Pulmonary neg pulmonary ROS   Pulmonary exam normal        Cardiovascular hypertension, Pt. on medications and Pt. on home beta blockers  Rhythm:Irregular Rate:Normal  ECHO 03/25:  1. Pt subsequently underwent successful cardioversion with 200J to sinus  rhythm; no immediate complications.   2. Left ventricular ejection fraction, by estimation, is 40 to 45%. The  left ventricle has mildly decreased function. The left ventricle  demonstrates global hypokinesis.   3. Right ventricular systolic function is normal. The right ventricular  size is normal.   4. Left atrial size was moderately dilated. No left atrial/left atrial  appendage thrombus was detected.   5. Right atrial size was moderately dilated.   6. A small pericardial effusion is present.   7. The mitral valve is normal in structure. Mild mitral valve  regurgitation.   8. The aortic valve is tricuspid. Aortic valve regurgitation is mild. No  aortic stenosis is present.   9. There is mild (Grade II) plaque involving the descending aorta.     Neuro/Psych negative neurological ROS  negative psych ROS   GI/Hepatic negative GI ROS, Neg liver ROS,,,  Endo/Other  negative endocrine ROS    Renal/GU negative Renal ROS  negative genitourinary   Musculoskeletal negative musculoskeletal ROS (+)    Abdominal Normal abdominal exam  (+)   Peds  Hematology Lab Results      Component                Value               Date                      WBC                      5.4                 09/21/2023                HGB                      13.9                09/21/2023                HCT                       44.6                09/21/2023                MCV                      98 (H)              09/21/2023                PLT                      263  09/21/2023              Anesthesia Other Findings   Reproductive/Obstetrics                             Anesthesia Physical Anesthesia Plan  ASA: 3  Anesthesia Plan: MAC   Post-op Pain Management:    Induction: Intravenous  PONV Risk Score and Plan: 3 and Ondansetron, Dexamethasone, Midazolam, Treatment may vary due to age or medical condition and Propofol  infusion  Airway Management Planned: Simple Face Mask and Nasal Cannula  Additional Equipment: None  Intra-op Plan:   Post-operative Plan:   Informed Consent: I have reviewed the patients History and Physical, chart, labs and discussed the procedure including the risks, benefits and alternatives for the proposed anesthesia with the patient or authorized representative who has indicated his/her understanding and acceptance.     Dental advisory given  Plan Discussed with: CRNA  Anesthesia Plan Comments:        Anesthesia Quick Evaluation

## 2023-09-26 NOTE — Interval H&P Note (Signed)
 History and Physical Interval Note:  09/26/2023 12:41 PM  [name]  has presented today for surgery, with the diagnosis of symptomatic atrial flutter.  The various methods of treatment have been discussed with the patient and family. After consideration of risks, benefits and other options for treatment, the patient has consented to  Procedure(s): A-FLUTTER ABLATION (N/A) LOOP RECORDER INSERTION (N/A) as a surgical intervention.  The patient's history has been reviewed, patient examined, no change in status, stable for surgery.  I have reviewed the patient's chart and labs.  Questions were answered to the patient's satisfaction.     Ardeen Kohler

## 2023-09-26 NOTE — Discharge Instructions (Signed)

## 2023-09-27 ENCOUNTER — Encounter (HOSPITAL_COMMUNITY): Payer: Self-pay | Admitting: Cardiology

## 2023-09-27 ENCOUNTER — Telehealth (HOSPITAL_COMMUNITY): Payer: Self-pay

## 2023-09-27 MED FILL — Propofol IV Emul 1000 MG/100ML (10 MG/ML): INTRAVENOUS | Qty: 71.64 | Status: AC

## 2023-09-27 MED FILL — Heparin Sodium (Porcine) Inj 1000 Unit/ML: INTRAMUSCULAR | Qty: 10 | Status: AC

## 2023-09-27 NOTE — Telephone Encounter (Signed)
 Spoke with patient to complete post procedure follow up call.  Patient reports no complications with groin sites.   Instructions reviewed with patient:  Remove large bandage at puncture site after 24 hours. It is normal to have bruising, tenderness and a pea or marble sized lump/knot at the groin site which can take up to three months to resolve.  Get help right away if you notice sudden swelling at the puncture site.  Check your puncture site every day for signs of infection: fever, redness, swelling, pus drainage, warmth, foul odor or excessive pain. If this occurs, please call the office at [phone], to speak with the nurse. Get help right away if your puncture site is bleeding and the bleeding does not stop after applying firm pressure to the area.  You may continue to have skipped beats/ atrial fibrillation during the first several months after your procedure.  It is very important not to miss any doses of your blood thinner Eliquis . Patient restarted taking this medication on yesterday, 09/26/23.   You will follow up with the APP on 10/25/23.[name]   Patient verbalized understanding to all instructions provided.

## 2023-09-27 NOTE — Telephone Encounter (Signed)
 Attempted to reach patient to follow up with procedure completed on 09/26/23, no answer. Left VM for patient to return call.

## 2023-09-27 NOTE — Transfer of Care (Addendum)
 Immediate Anesthesia Transfer of Care Note  Patient: [name]  Procedure(s) Performed: A-FLUTTER ABLATION  Patient Location: PACU  Anesthesia Type:General  Level of Consciousness: awake  Airway & Oxygen Therapy: Patient Spontanous Breathing  Post-op Assessment: Report given to RN and Post -op Vital signs reviewed and stable  Post vital signs: Reviewed  Last Vitals:  Vitals Value Taken Time  BP 134/101 09/26/23 1800  Temp 36.7 C 09/26/23 1525  Pulse 84 09/26/23 1823  Resp 20 09/26/23 1823  SpO2 95 % 09/26/23 1823  Vitals shown include unfiled device data.  Last Pain:  Vitals:   09/26/23 1535  TempSrc:   PainSc: 0-No pain         Complications: There were no known notable events for this encounter.

## 2023-09-27 NOTE — Anesthesia Postprocedure Evaluation (Signed)
 Anesthesia Post Note  Patient: [name]  Procedure(s) Performed: A-FLUTTER ABLATION     Patient location during evaluation: PACU Anesthesia Type: MAC Level of consciousness: awake and alert Pain management: pain level controlled Vital Signs Assessment: post-procedure vital signs reviewed and stable Respiratory status: spontaneous breathing, nonlabored ventilation, respiratory function stable and patient connected to nasal cannula oxygen Cardiovascular status: blood pressure returned to baseline and stable Postop Assessment: no apparent nausea or vomiting Anesthetic complications: no   There were no known notable events for this encounter.  Last Vitals:  Vitals:   09/26/23 1800 09/26/23 1815  BP: (!) 134/101   Pulse: 72 80  Resp: 19 15  Temp:    SpO2: 95% 95%    Last Pain:  Vitals:   09/26/23 1535  TempSrc:   PainSc: 0-No pain                 Theotis Flake P Keng Jewel

## 2023-09-28 ENCOUNTER — Ambulatory Visit (HOSPITAL_COMMUNITY): Admitting: Internal Medicine

## 2023-10-24 NOTE — Progress Notes (Unsigned)
  Electrophysiology Office Note:   Date:  10/25/2023  ID:  ARLAYNE LIGGINS, DOB July 19, 1945, MRN [id number]  Primary Cardiologist: Ahmad Alert, MD Electrophysiologist: Ardeen Kohler, MD      History of Present Illness:   [name] is a 78 y.o. female with h/o HTN, MVP, h/o Ovarian CA, and AFL seen today for routine electrophysiology follow-up s/p Ablation.  Since last being seen in our clinic the patient reports doing very well. No breakthrough AF of which she is aware. Helped her set up AF notifications on her phone in office today. Otherwise,  she denies chest pain, palpitations, dyspnea, PND, orthopnea, nausea, vomiting, dizziness, syncope, edema, weight gain, or early satiety.    Review of systems complete and found to be negative unless listed in HPI.   EP Information / Studies Reviewed:    EKG is ordered today. Personal review as below.  EKG Interpretation Date/Time:  Tuesday Oct 25 2023 12:01:06 EDT Ventricular Rate:  72 PR Interval:  158 QRS Duration:  92 QT Interval:  400 QTC Calculation: 438 R Axis:   9  Text Interpretation: Normal sinus rhythm Incomplete right bundle branch block Anterior infarct (cited on or before 01-Sep-2007) When compared with ECG of 26-Sep-2023 15:05, Serial changes of Anterior infarct Present Confirmed by Pilar Bridge [id number]) on 10/25/2023 12:23:48 PM    Arrhythmia/Device History S/p AFL/CTI ablation 09/26/2023   Physical Exam:   VS:  BP (!) 140/86   Pulse 72   Ht 5' 9.5" (1.765 m)   Wt 151 lb 9.6 oz (68.8 kg)   SpO2 99%   BMI 22.07 kg/m    Wt Readings from Last 3 Encounters:  10/25/23 151 lb 9.6 oz (68.8 kg)  09/26/23 156 lb (70.8 kg)  09/09/23 156 lb 9.6 oz (71 kg)     GEN: No acute distress NECK: No JVD; No carotid bruits CARDIAC: Regular rate and rhythm, no murmurs, rubs, gallops RESPIRATORY:  Clear to auscultation without rales, wheezing or rhonchi  ABDOMEN: Soft, non-tender, non-distended EXTREMITIES:  No edema; No  deformity   ASSESSMENT AND PLAN:    Atrial flutter EKG today shows NSR Continue toprol  50 mg BID With only flutter identified thus far, will STOP eliquis  and monitor rhythm with Apple Watch. Personally helped set up notifications through her watch and phone today.  If she has issues with the technology, discussed loop recorder as an additional consideration.   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   HTN Stable on current regimen   Follow up with EP team in 6 months  Signed, Tylene Galla, PA-C

## 2023-10-25 ENCOUNTER — Encounter: Payer: Self-pay | Admitting: Student

## 2023-10-25 ENCOUNTER — Ambulatory Visit: Attending: Student | Admitting: Student

## 2023-10-25 VITALS — BP 140/86 | HR 72 | Ht 69.5 in | Wt 151.6 lb

## 2023-10-25 DIAGNOSIS — I483 Typical atrial flutter: Secondary | ICD-10-CM | POA: Diagnosis not present

## 2023-10-25 DIAGNOSIS — I341 Nonrheumatic mitral (valve) prolapse: Secondary | ICD-10-CM

## 2023-10-25 DIAGNOSIS — I10 Essential (primary) hypertension: Secondary | ICD-10-CM | POA: Diagnosis not present

## 2023-10-25 DIAGNOSIS — D6869 Other thrombophilia: Secondary | ICD-10-CM

## 2023-10-25 NOTE — Patient Instructions (Signed)
 Medication Instructions:  Stop Eliquis . *If you need a refill on your cardiac medications before your next appointment, please call your pharmacy*  Lab Work: No labwork ordered today. If you have labs (blood work) drawn today and your tests are completely normal, you will receive your results only by: MyChart Message (if you have MyChart) OR A paper copy in the mail If you have any lab test that is abnormal or we need to change your treatment, we will call you to review the results.  Testing/Procedures: No testing ordered today  Follow-Up: At [facility], you and your health needs are our priority.  As part of our continuing mission to provide you with exceptional heart care, our providers are all part of one team.  This team includes your primary Cardiologist (physician) and Advanced Practice Providers or APPs (Physician Assistants and Nurse Practitioners) who all work together to provide you with the care you need, when you need it.  Your next appointment:   6 month(s)  Provider:   You may see Ardeen Kohler, MD or one of the following Advanced Practice Providers on your designated Care Team:   Mertha Abrahams, Kennard Pea "Jonelle Neri" [place], PA-C Suzann Riddle, NP Creighton Doffing, NP    We recommend signing up for the patient portal called "MyChart".  Sign up information is provided on this After Visit Summary.  MyChart is used to connect with patients for Virtual Visits (Telemedicine).  Patients are able to view lab/test results, encounter notes, upcoming appointments, etc.  Non-urgent messages can be sent to your provider as well.   To learn more about what you can do with MyChart, go to [email or web address].    Notes: Continue to monitor your heart rhythm with your Apple Watch.

## 2023-11-16 ENCOUNTER — Telehealth: Payer: Self-pay | Admitting: Cardiovascular Disease

## 2023-11-16 DIAGNOSIS — I10 Essential (primary) hypertension: Secondary | ICD-10-CM

## 2023-11-16 DIAGNOSIS — Z79899 Other long term (current) drug therapy: Secondary | ICD-10-CM

## 2023-11-16 MED ORDER — LOSARTAN POTASSIUM 100 MG PO TABS
ORAL_TABLET | ORAL | 2 refills | Status: AC
Start: 2023-11-16 — End: ?

## 2023-11-16 NOTE — Telephone Encounter (Signed)
*  STAT* If patient is at the pharmacy, call can be transferred to refill team.   1. Which medications need to be refilled? (please list name of each medication and dose if known)   losartan  (COZAAR ) 100 MG tablet    2. Which pharmacy/location (including street and city if local pharmacy) is medication to be sent to? [id number] - [place], [place] - [address] AT [address]CROSS FROM [address]   3. Do they need a 30 day or 90 day supply? 90

## 2023-11-16 NOTE — Telephone Encounter (Signed)
 Pt's medication was sent to pt's pharmacy as requested. Confirmation received.

## 2024-05-07 ENCOUNTER — Ambulatory Visit: Admitting: Student

## 2024-06-19 ENCOUNTER — Ambulatory Visit: Attending: Student | Admitting: Student

## 2024-06-19 ENCOUNTER — Encounter: Payer: Self-pay | Admitting: Student

## 2024-06-19 VITALS — BP 160/98 | HR 69 | Ht 69.5 in | Wt 155.0 lb

## 2024-06-19 DIAGNOSIS — Z79899 Other long term (current) drug therapy: Secondary | ICD-10-CM | POA: Diagnosis not present

## 2024-06-19 DIAGNOSIS — I10 Essential (primary) hypertension: Secondary | ICD-10-CM

## 2024-06-19 DIAGNOSIS — I483 Typical atrial flutter: Secondary | ICD-10-CM

## 2024-06-19 NOTE — Progress Notes (Signed)
" °  Electrophysiology Office Note:   Date:  06/19/2024  ID:  [name], DOB 04-11-1946, MRN [id number]  Primary Cardiologist: Aleene Passe, MD (Inactive) Electrophysiologist: Fonda Kitty, MD   Electrophysiologist:  Fonda Kitty, MD      History of Present Illness:   [name] is a 79 y.o. female with h/o HTN, MVP, h/o Ovarian CA, and AFL seen today for routine electrophysiology followup.   Since last being seen in our clinic the patient reports doing very well. Overall, she denies chest pain, palpitations, dyspnea, PND, orthopnea, nausea, vomiting, dizziness, syncope, edema, weight gain, or early satiety.  Has been off Eliquis , no alerts of irregular HR by her watch.  Review of systems complete and found to be negative unless listed in HPI.   EP Information / Studies Reviewed:    EKG is ordered today. Personal review as below.  EKG Interpretation Date/Time:  Tuesday June 19 2024 10:31:29 EST Ventricular Rate:  69 PR Interval:  154 QRS Duration:  90 QT Interval:  404 QTC Calculation: 432 R Axis:   -28  Text Interpretation: Sinus rhythm with Fusion complexes Electrode noise Confirmed by Lesia Heck ([id number]) on 06/19/2024 10:34:56 AM    Arrhythmia/Device History S/p AFL/CTI ablation 09/26/2023   Physical Exam:   VS:  BP (!) 160/98   Pulse 69   Ht 5' 9.5 (1.765 m)   Wt 155 lb (70.3 kg)   SpO2 99%   BMI 22.56 kg/m    Wt Readings from Last 3 Encounters:  06/19/24 155 lb (70.3 kg)  10/25/23 151 lb 9.6 oz (68.8 kg)  09/26/23 156 lb (70.8 kg)     GEN: No acute distress NECK: No JVD; No carotid bruits CARDIAC: Regular rate and rhythm, no murmurs, rubs, gallops RESPIRATORY:  Clear to auscultation without rales, wheezing or rhonchi  ABDOMEN: Soft, non-tender, non-distended EXTREMITIES:  No edema; No deformity   ASSESSMENT AND PLAN:    Atrial flutter Secondary hypercoagulable state EKG today shows NSR (tremors with noisy baseline) Continue toprol  50 mg  BID Off eliquis  without recurrence and no AF previously identified.  She will continue to monitor via her Apple Watch.     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   Follow up with EP Team in 12 months Follow up with Gen Cards 2-3 months (last seen 07/2023)  Signed, Ozell Prentice Lesia, PA-C  "

## 2024-06-19 NOTE — Patient Instructions (Addendum)
 Medication Instructions:  No medication changes today. *If you need a refill on your cardiac medications before your next appointment, please call your pharmacy*  Lab Work: No labwork ordered today. If you have labs (blood work) drawn today and your tests are completely normal, you will receive your results only by: MyChart Message (if you have MyChart) OR A paper copy in the mail If you have any lab test that is abnormal or we need to change your treatment, we will call you to review the results.  Testing/Procedures: Echocardiogram ordered today  Follow-Up: At [facility], you and your health needs are our priority.  As part of our continuing mission to provide you with exceptional heart care, our providers are all part of one team.  This team includes your primary Cardiologist (physician) and Advanced Practice Providers or APPs (Physician Assistants and Nurse Practitioners) who all work together to provide you with the care you need, when you need it.  Your next appointment:   2-3 months with Glendia Ferrier, PA-C 12 month(s) with Electrophysiology   Provider:   You may see Fonda Kitty, MD or one of the following Advanced Practice Providers on your designated Care Team:   Charlies Arthur, PA-C Khyler Eschmann Andy Jacqulin Branden[place]ger, PA-C Suzann Riddle, NP Daphne Barrack, NP    We recommend signing up for the patient portal called MyChart.  Sign up information is provided on this After Visit Summary.  MyChart is used to connect with patients for Virtual Visits (Telemedicine).  Patients are able to view lab/test results, encounter notes, upcoming appointments, etc.  Non-urgent messages can be sent to your provider as well.   To learn more about what you can do with MyChart, go to [email or web address].

## 2024-08-01 ENCOUNTER — Ambulatory Visit (HOSPITAL_COMMUNITY)
